# Patient Record
Sex: Male | Born: 1935 | Race: White | Hispanic: No | Marital: Married | Smoking: Former smoker
Health system: Southern US, Community
[De-identification: ages and names within clinical notes are randomized; demographics above are authoritative.]

## PROBLEM LIST (undated history)

## (undated) DIAGNOSIS — K529 Noninfective gastroenteritis and colitis, unspecified: Secondary | ICD-10-CM

## (undated) DIAGNOSIS — F0390 Unspecified dementia without behavioral disturbance: Secondary | ICD-10-CM

## (undated) DIAGNOSIS — E785 Hyperlipidemia, unspecified: Secondary | ICD-10-CM

## (undated) DIAGNOSIS — L409 Psoriasis, unspecified: Secondary | ICD-10-CM

## (undated) DIAGNOSIS — M1990 Unspecified osteoarthritis, unspecified site: Secondary | ICD-10-CM

## (undated) DIAGNOSIS — K219 Gastro-esophageal reflux disease without esophagitis: Secondary | ICD-10-CM

## (undated) DIAGNOSIS — R519 Headache, unspecified: Secondary | ICD-10-CM

## (undated) DIAGNOSIS — IMO0001 Reserved for inherently not codable concepts without codable children: Secondary | ICD-10-CM

## (undated) DIAGNOSIS — C4490 Unspecified malignant neoplasm of skin, unspecified: Secondary | ICD-10-CM

## (undated) DIAGNOSIS — Z87442 Personal history of urinary calculi: Secondary | ICD-10-CM

## (undated) DIAGNOSIS — I679 Cerebrovascular disease, unspecified: Secondary | ICD-10-CM

## (undated) DIAGNOSIS — I639 Cerebral infarction, unspecified: Secondary | ICD-10-CM

## (undated) DIAGNOSIS — I509 Heart failure, unspecified: Secondary | ICD-10-CM

## (undated) DIAGNOSIS — I499 Cardiac arrhythmia, unspecified: Secondary | ICD-10-CM

## (undated) DIAGNOSIS — G4730 Sleep apnea, unspecified: Secondary | ICD-10-CM

## (undated) DIAGNOSIS — N400 Enlarged prostate without lower urinary tract symptoms: Secondary | ICD-10-CM

## (undated) HISTORY — PX: COLONOSCOPY: SHX174

## (undated) HISTORY — PX: UPPER GI ENDOSCOPY: SHX6162

---

## 1998-07-29 DIAGNOSIS — I639 Cerebral infarction, unspecified: Secondary | ICD-10-CM

## 1998-07-29 HISTORY — DX: Cerebral infarction, unspecified: I63.9

## 2006-03-04 ENCOUNTER — Ambulatory Visit: Payer: Self-pay | Admitting: Ophthalmology

## 2006-03-10 ENCOUNTER — Ambulatory Visit: Payer: Self-pay | Admitting: Ophthalmology

## 2007-01-06 ENCOUNTER — Ambulatory Visit: Payer: Self-pay | Admitting: Internal Medicine

## 2009-04-18 ENCOUNTER — Ambulatory Visit: Payer: Self-pay | Admitting: Internal Medicine

## 2009-04-18 ENCOUNTER — Ambulatory Visit: Payer: Self-pay | Admitting: Ophthalmology

## 2009-05-01 ENCOUNTER — Ambulatory Visit: Payer: Self-pay | Admitting: Ophthalmology

## 2009-05-31 ENCOUNTER — Emergency Department: Payer: Self-pay | Admitting: Emergency Medicine

## 2009-08-23 ENCOUNTER — Ambulatory Visit: Payer: Self-pay | Admitting: Gastroenterology

## 2012-03-18 ENCOUNTER — Ambulatory Visit: Payer: Self-pay

## 2012-06-02 ENCOUNTER — Ambulatory Visit: Payer: Self-pay | Admitting: Urology

## 2013-01-03 ENCOUNTER — Emergency Department: Payer: Self-pay | Admitting: Emergency Medicine

## 2013-01-03 LAB — CBC
HGB: 13.4 g/dL (ref 13.0–18.0)
MCH: 30.6 pg (ref 26.0–34.0)
MCHC: 33.2 g/dL (ref 32.0–36.0)
RBC: 4.39 10*6/uL — ABNORMAL LOW (ref 4.40–5.90)
RDW: 13.7 % (ref 11.5–14.5)

## 2013-01-03 LAB — URINALYSIS, COMPLETE
Bacteria: NONE SEEN
Bilirubin,UR: NEGATIVE
Glucose,UR: NEGATIVE mg/dL (ref 0–75)
Ketone: NEGATIVE
Leukocyte Esterase: NEGATIVE
Nitrite: NEGATIVE
Ph: 5 (ref 4.5–8.0)
Protein: NEGATIVE
Specific Gravity: 1.028 (ref 1.003–1.030)
Squamous Epithelial: NONE SEEN
WBC UR: 1 /HPF (ref 0–5)

## 2013-01-03 LAB — COMPREHENSIVE METABOLIC PANEL
BUN: 19 mg/dL — ABNORMAL HIGH (ref 7–18)
Calcium, Total: 8.8 mg/dL (ref 8.5–10.1)
Chloride: 109 mmol/L — ABNORMAL HIGH (ref 98–107)
Co2: 23 mmol/L (ref 21–32)
Creatinine: 1.19 mg/dL (ref 0.60–1.30)
EGFR (Non-African Amer.): 58 — ABNORMAL LOW
Potassium: 4.3 mmol/L (ref 3.5–5.1)
SGOT(AST): 30 U/L (ref 15–37)
Sodium: 140 mmol/L (ref 136–145)
Total Protein: 7.1 g/dL (ref 6.4–8.2)

## 2013-01-03 LAB — PROTIME-INR
INR: 2.3
Prothrombin Time: 24.6 secs — ABNORMAL HIGH (ref 11.5–14.7)

## 2013-01-05 ENCOUNTER — Emergency Department: Payer: Self-pay | Admitting: Emergency Medicine

## 2013-01-05 LAB — BASIC METABOLIC PANEL
Anion Gap: 4 — ABNORMAL LOW (ref 7–16)
Calcium, Total: 8 mg/dL — ABNORMAL LOW (ref 8.5–10.1)
Chloride: 106 mmol/L (ref 98–107)
Creatinine: 1.27 mg/dL (ref 0.60–1.30)
EGFR (African American): >60
EGFR (Non-African Amer.): 54 — ABNORMAL LOW
Glucose: 115 mg/dL — ABNORMAL HIGH (ref 65–99)
Osmolality: 279 (ref 275–301)
Potassium: 4 mmol/L (ref 3.5–5.1)
Sodium: 138 mmol/L (ref 136–145)

## 2013-01-15 ENCOUNTER — Ambulatory Visit: Payer: Self-pay | Admitting: Urology

## 2013-04-16 ENCOUNTER — Other Ambulatory Visit: Payer: Self-pay | Admitting: Gastroenterology

## 2013-04-16 LAB — WBCS, STOOL

## 2013-04-19 LAB — STOOL CULTURE

## 2013-08-12 IMAGING — CT CT ABD-PELV W/ CM
1 of 3 series · 13 of 32 positions shown, 18 images · non-contrast
Comparison: none

REASON FOR EXAM: [id number]  after 3oclock dr Gaucin [id number]  back pain
nausea  abd pain elev...
COMMENTS:

[Series 2: 3mm soft tissue · axial · 0.84mm/px · z∈[-462,-57]mm · 13 of 157 slices shown, 18 images]
[im 11/157  soft-tissue]
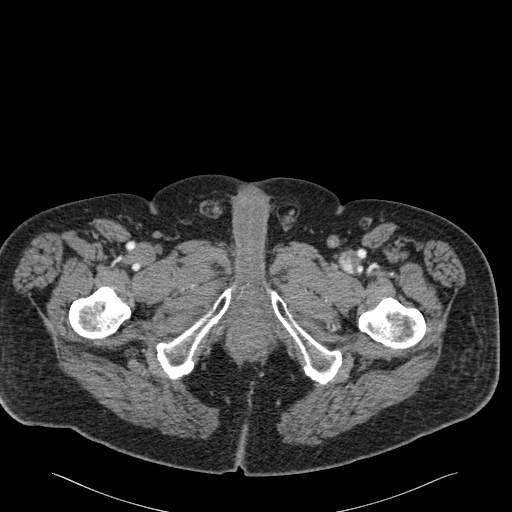
[im 11/157  bone]
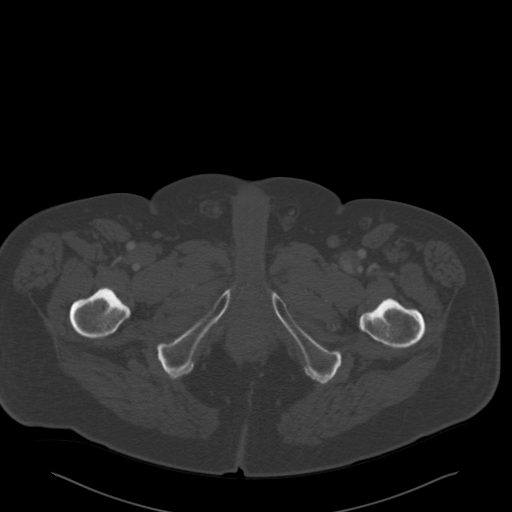
[im 21/157  soft-tissue]
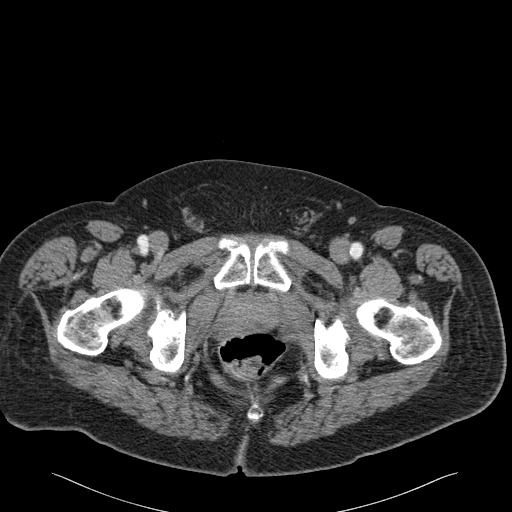
[im 32/157  soft-tissue]
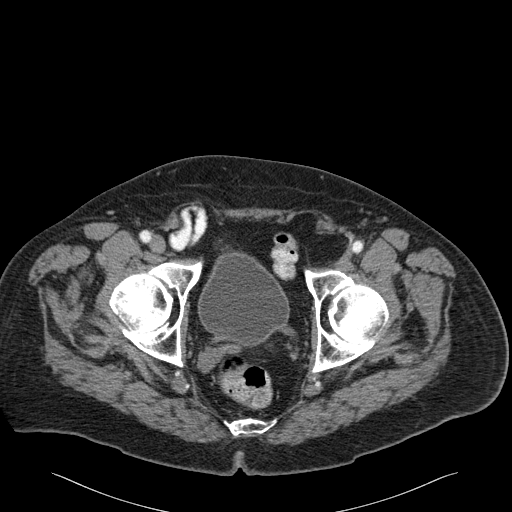
[im 53/157  soft-tissue]
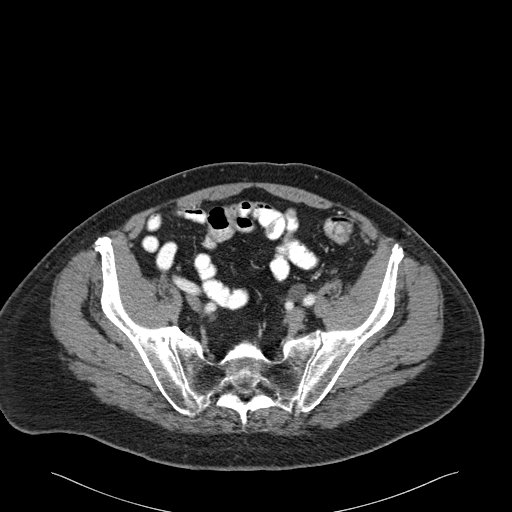
[im 63/157  soft-tissue]
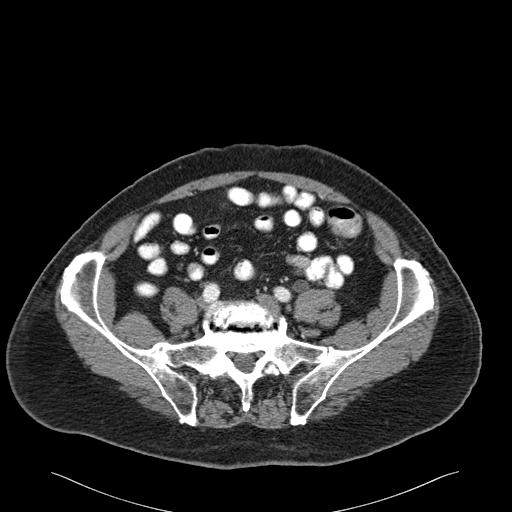
[im 73/157  soft-tissue]
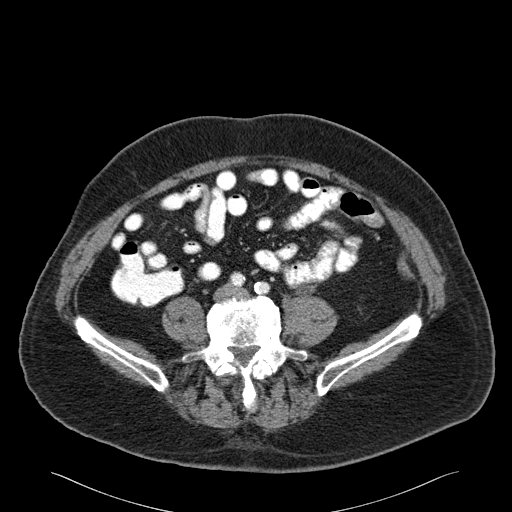
[im 84/157  soft-tissue]
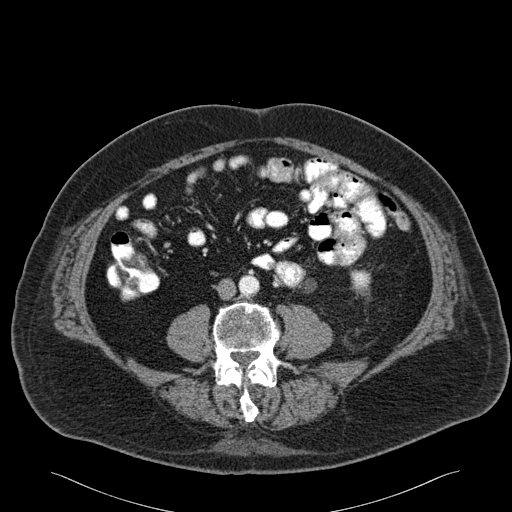
[im 94/157  soft-tissue]
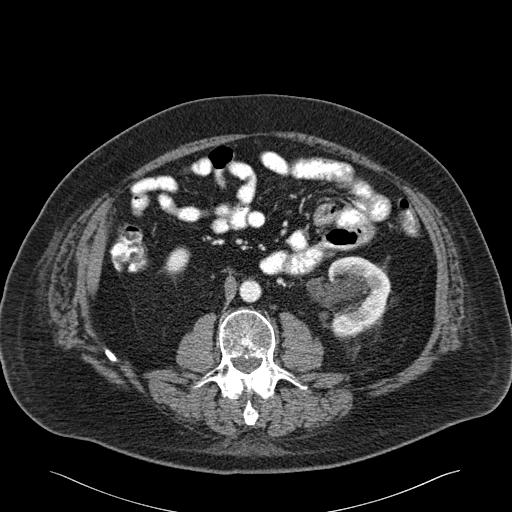
[im 105/157  soft-tissue]
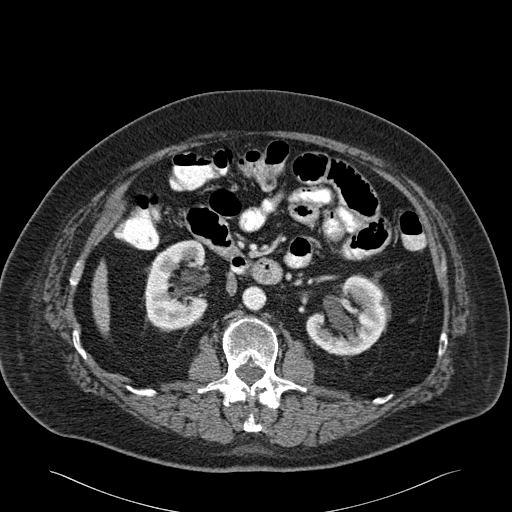
[im 105/157  bone]
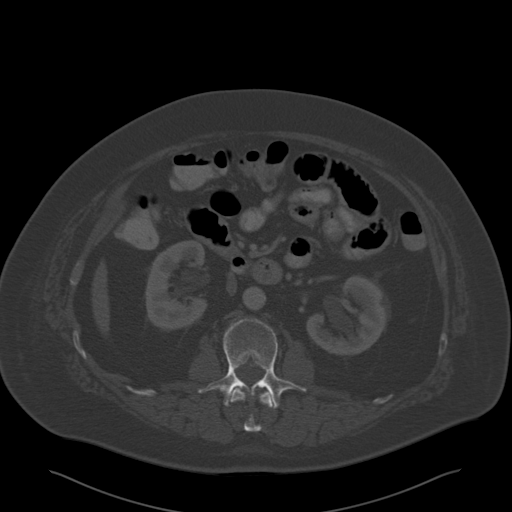
[im 115/157  lung]
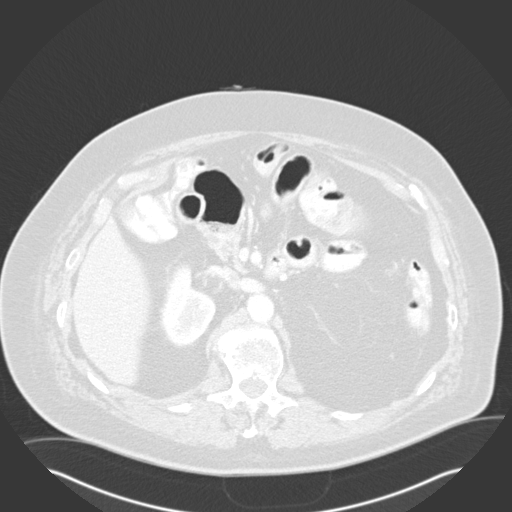
[im 125/157  soft-tissue]
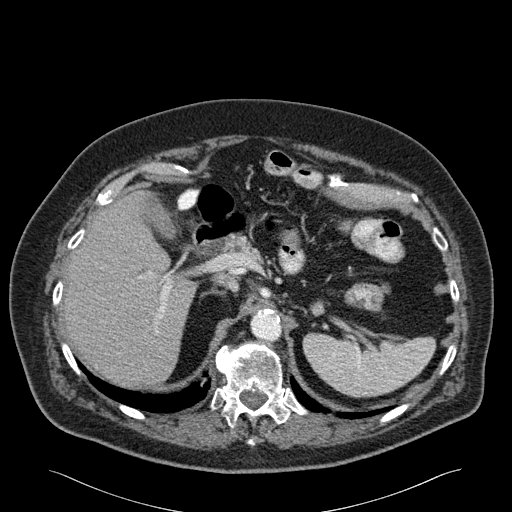
[im 125/157  lung]
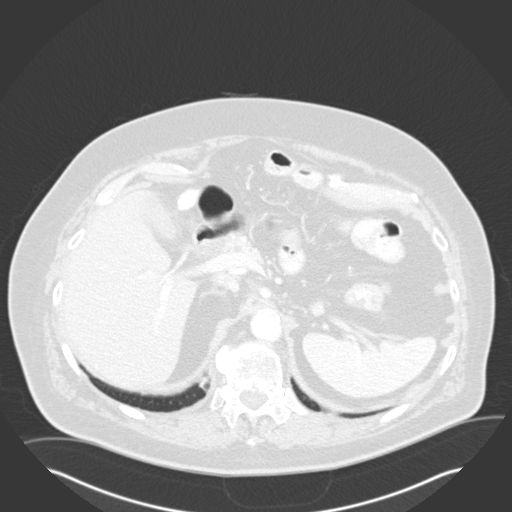
[im 136/157  soft-tissue]
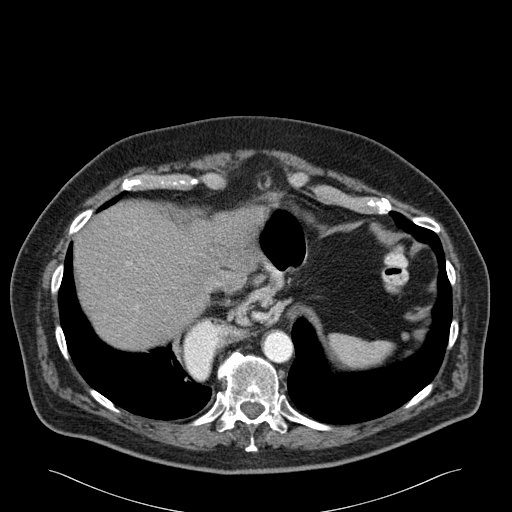
[im 136/157  lung]
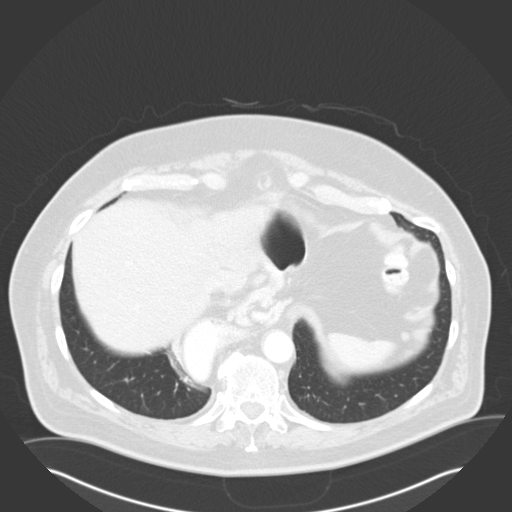
[im 146/157  soft-tissue]
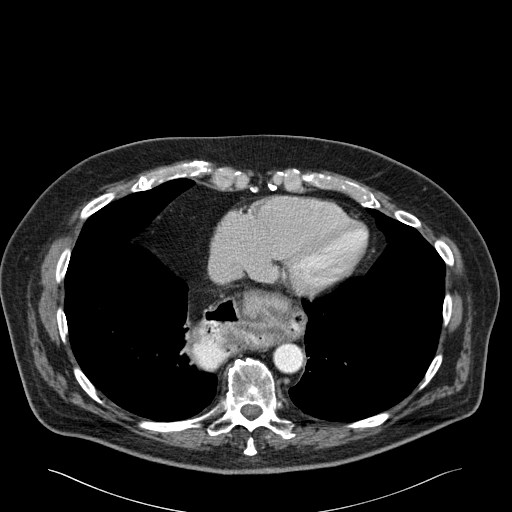
[im 146/157  lung]
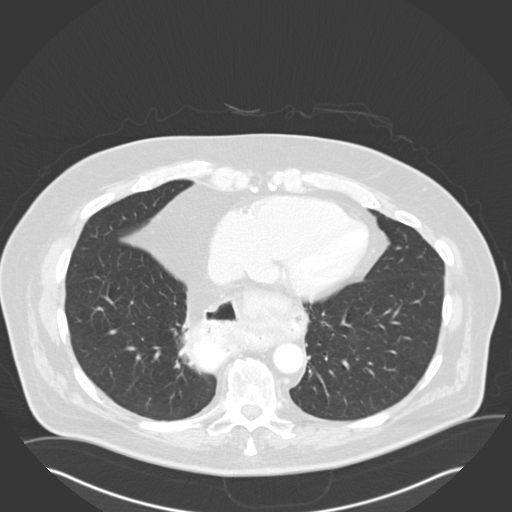

[13 of 32 positions shown; findings below may reference images not displayed]

PROCEDURE:     CT  - CT ABDOMEN / PELVIS  W  - March 18, 2012  [DATE]

RESULT:     Axial CT scanning was performed through the abdomen and pelvis
with reconstructions at 3 mm intervals and slice thicknesses following
intravenous administration of 100 cc of Lsovue-400. The patient also
received oral contrast material. Review of multiplanar reconstructed images
was performed separately on the VIA monitor.

The liver exhibits no focal mass or ductal dilation. The gallbladder is
adequately distended with no evidence of stones or acute inflammatory
changes. The pancreas exhibits no acute abnormality. A portion of the
pancreatic body extends into the thorax with the adjacent partially
intrathoracic stomach. The spleen is not enlarged. There are no adrenal
masses.

The kidneys exhibit parapelvic cysts. There is mild hydronephrosis and
hydroureter on the left which appears to be related to soft tissue density
at the left ureterovesical junction. No stone is demonstrated. No
significant increased density in the perinephric fat is demonstrated. The
right ureter is normal in caliber.

The orally administered contrast has traversed the small bowel. There are
loops of small bowel in a right inguinal hernia which does not appear to be
incarcerated. Contrast has reached as far distally as the distal descending
colon. There is no evidence of a large bowel obstruction. There is no
evidence of acute diverticulitis. There is no psoas abscess. The partially
distended urinary bladder exhibits small diverticula. The perivesical soft
tissues appear normal. The prostate gland is normal in density. There is
mild fullness of the left seminal vesicle.
IMPRESSION: 1. There is mild hydronephrosis and hydroureter on the left without
significant inflammatory change in the surrounding perinephric fat. This
appears to be related to some narrowing of the ureterovesical junction on
the left which may be due to intrinsic bladder or prostate processes.  No
stones are identified. The right kidney and ureter appear normal. There are
bladder diverticula. Urologic consultation and possible cystoscopy is
recommended.
2. There is a small right inguinal hernia containing loops of normal
appearing small bowel. There is no evidence of a small or large bowel
obstruction. There is no evidence of acute diverticulitis or other acute
bowel inflammation.
3. There is no evidence of acute hepatobiliary abnormality.
4. There is mild fullness of the left aspect of the seminal vesicles.

This report was discussed by me with Dr. Geovanny by telephone at [DATE] p.m. on
18 March, 2012.

[REDACTED]

## 2014-06-11 IMAGING — CT CT PELVIS W/O CM
1 series · 16 of 32 positions shown, 20 images · non-contrast
Comparison: none

REASON FOR EXAM: UVJ Stone
COMMENTS:

PROCEDURE:     CT  - CT PELVIS STANDARD WO  - January 15, 2013  [DATE]
RESULT:     Comparison is made to a prior study dated 01/03/2013.
TECHNIQUE: Helical noncontrasted 3 mm sections were obtained from the
superior iliac crest through the pubic symphysis.

[Series 2: pelvis · axial · 0.84mm/px · z∈[-600,-336]mm · 16 of 99 slices shown, 20 images]
[im 7/99  soft-tissue]
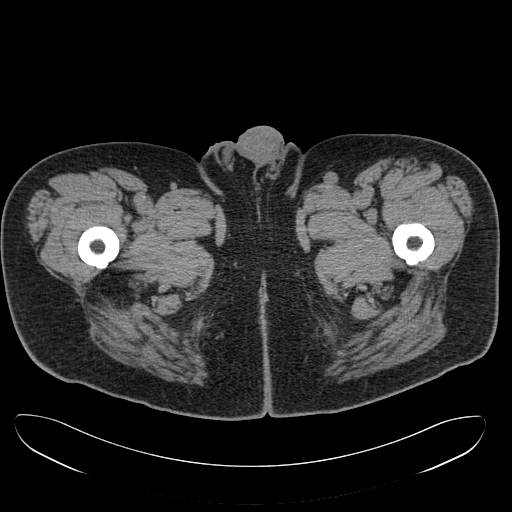
[im 7/99  bone]
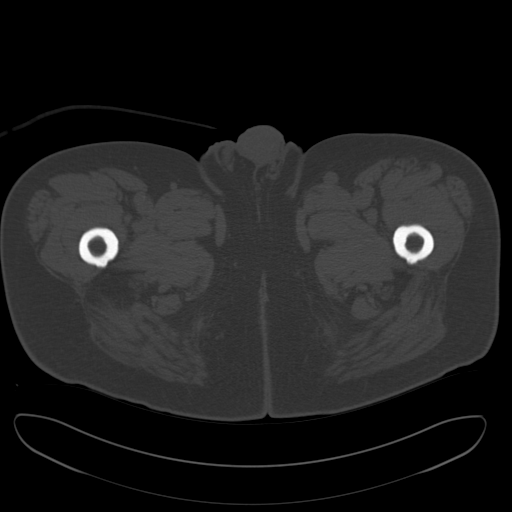
[im 13/99  soft-tissue]
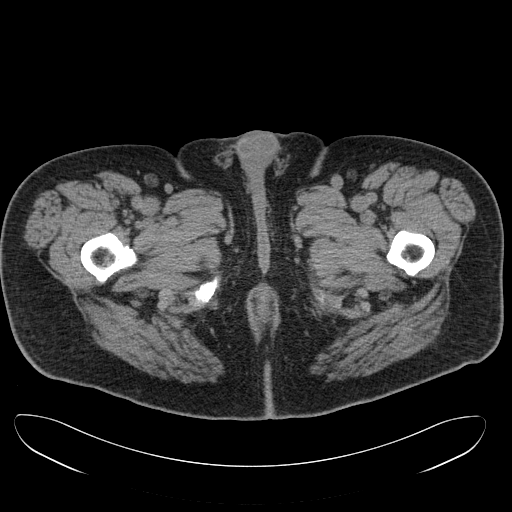
[im 19/99  soft-tissue]
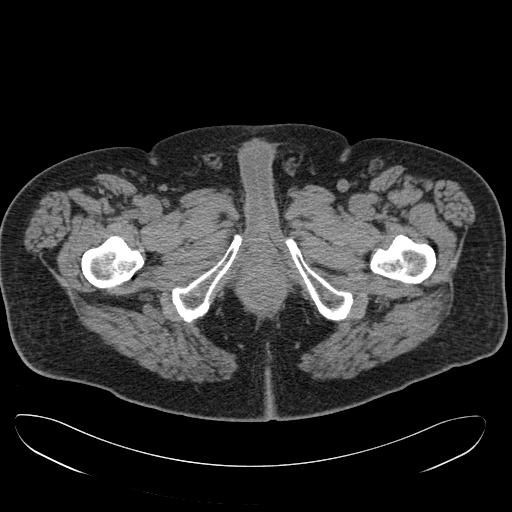
[im 26/99  soft-tissue]
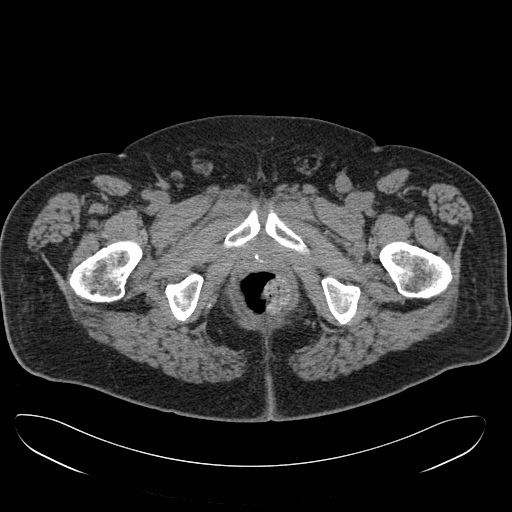
[im 32/99  soft-tissue]
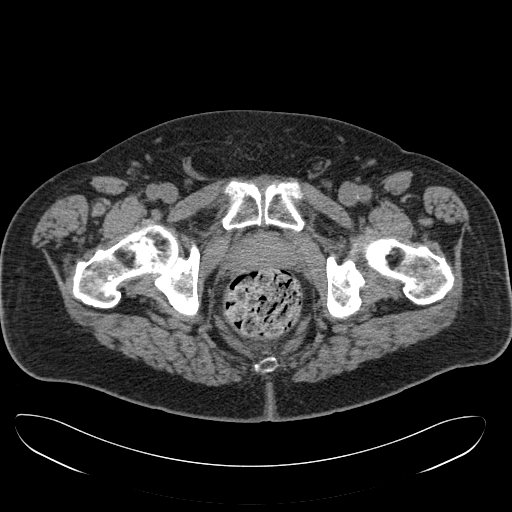
[im 38/99  soft-tissue]
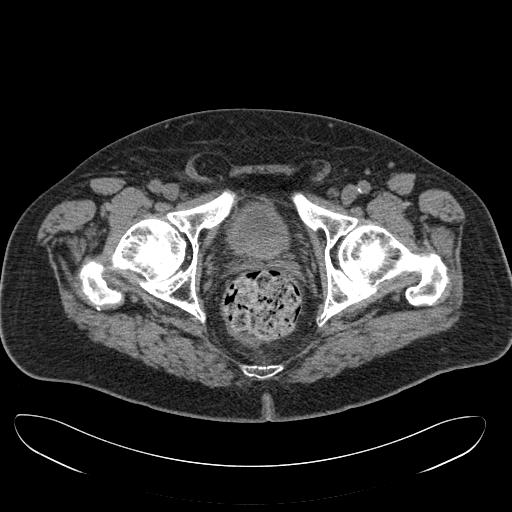
[im 45/99  soft-tissue]
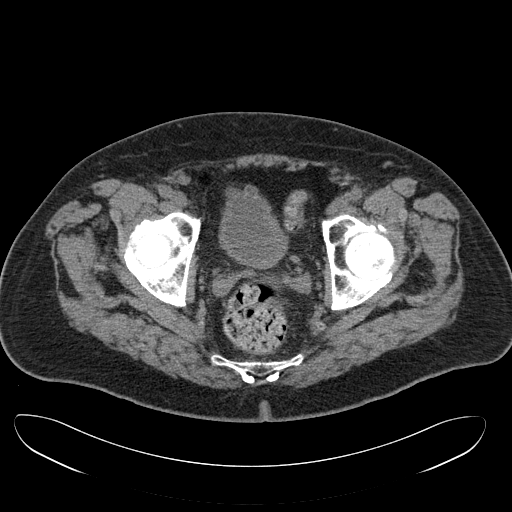
[im 54/99  soft-tissue]
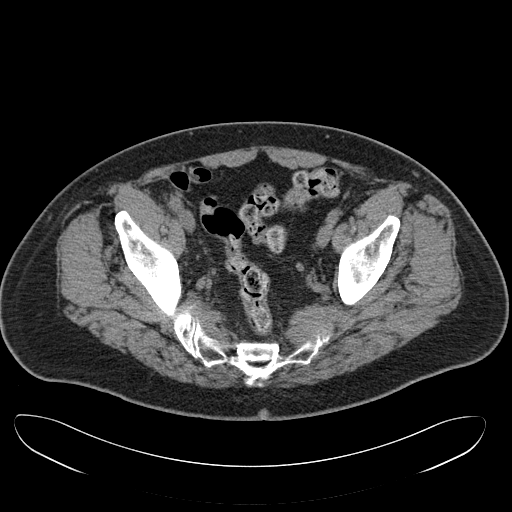
[im 61/99  soft-tissue]
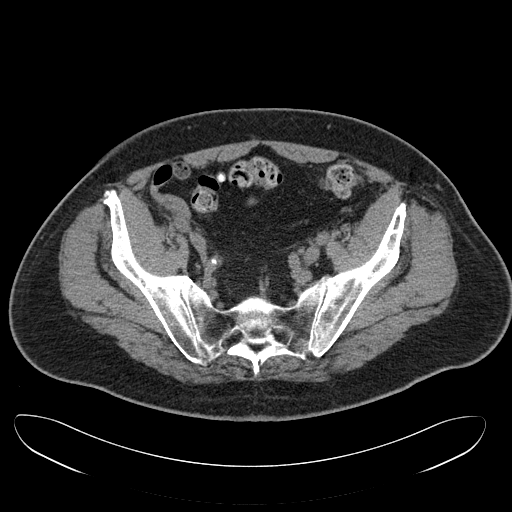
[im 61/99  bone]
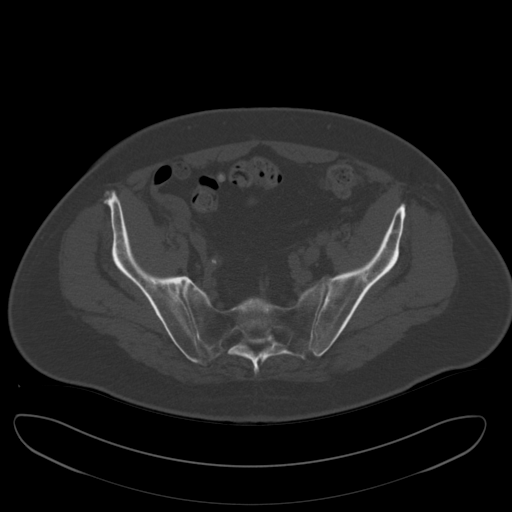
[im 67/99  soft-tissue]
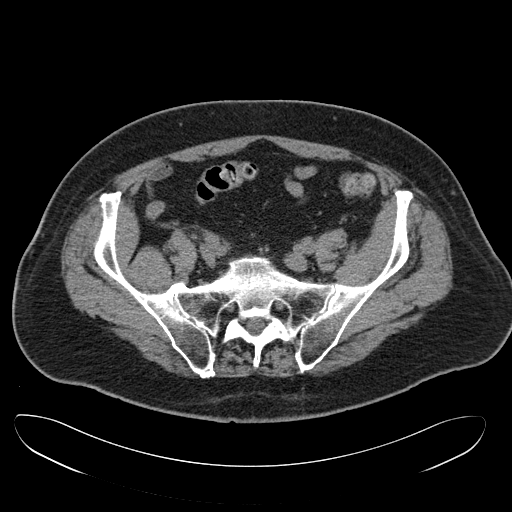
[im 73/99  soft-tissue]
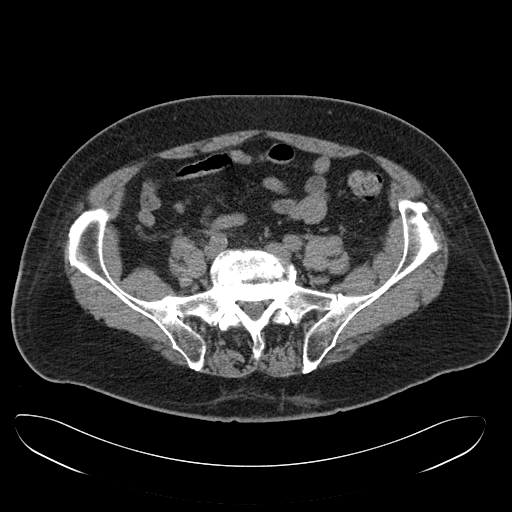
[im 80/99  soft-tissue]
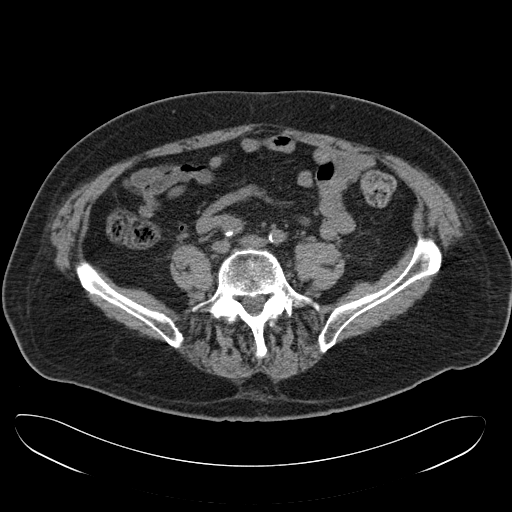
[im 86/99  soft-tissue]
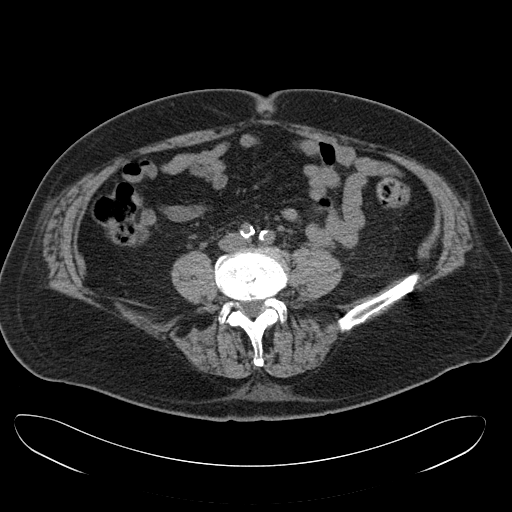
[im 86/99  lung]
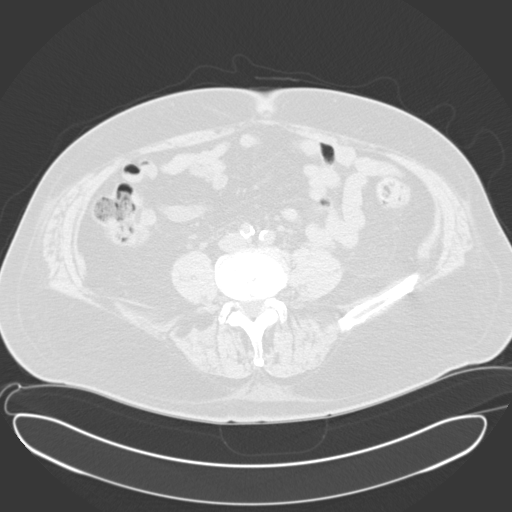
[im 89/99  lung]
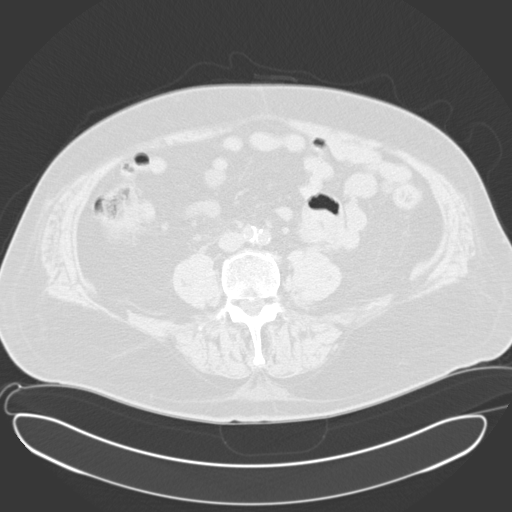
[im 92/99  soft-tissue]
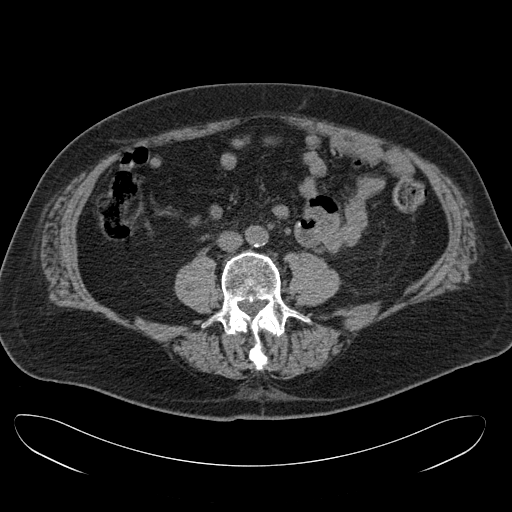
[im 92/99  lung]
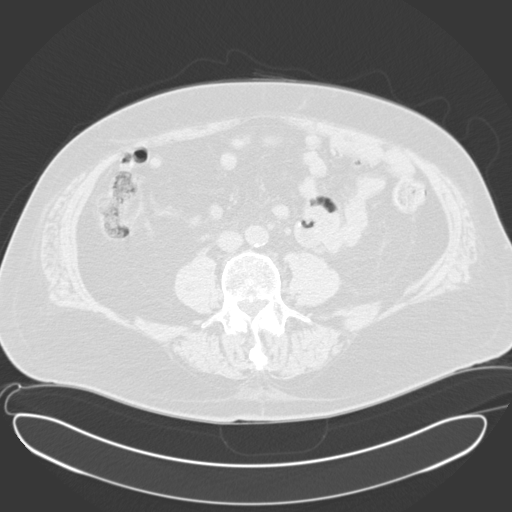
[im 95/99  lung]
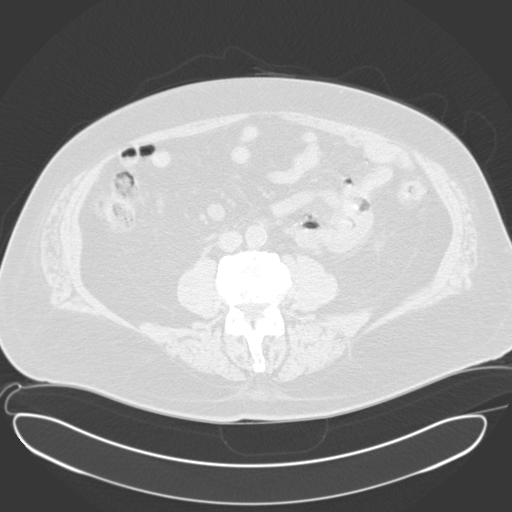

[16 of 32 positions shown; findings below may reference images not displayed]

FINDINGS: The previously described 3 millimeter calculus within the distal
right ureter at the level of the ureterovesical junction is not appreciated
on the present study. There does not appear to be significant hydroureter.
There is no evidence of pelvic masses, free fluid nor loculated fluid
collections. There is no evidence of bowel obstruction.
IMPRESSION: 1. The previously described distal right ureteral calculus is not
appreciated on the present study. The calculus is described at the level of
the ureterovesical junction.

## 2015-06-16 ENCOUNTER — Encounter: Payer: Self-pay | Admitting: *Deleted

## 2015-06-19 ENCOUNTER — Encounter: Payer: Self-pay | Admitting: *Deleted

## 2015-06-19 ENCOUNTER — Ambulatory Visit: Payer: Medicare Other | Admitting: Anesthesiology

## 2015-06-19 ENCOUNTER — Ambulatory Visit
Admission: RE | Admit: 2015-06-19 | Discharge: 2015-06-19 | Disposition: A | Discharge status: Home / Self Care | Payer: Medicare Other | Source: Ambulatory Visit | Attending: Gastroenterology | Admitting: Gastroenterology

## 2015-06-19 ENCOUNTER — Encounter
Admission: RE | Disposition: A | Discharge status: Home / Self Care | Payer: Self-pay | Source: Ambulatory Visit | Attending: Gastroenterology

## 2015-06-19 DIAGNOSIS — Z87891 Personal history of nicotine dependence: Secondary | ICD-10-CM | POA: Diagnosis not present

## 2015-06-19 DIAGNOSIS — L409 Psoriasis, unspecified: Secondary | ICD-10-CM | POA: Diagnosis not present

## 2015-06-19 DIAGNOSIS — D122 Benign neoplasm of ascending colon: Secondary | ICD-10-CM | POA: Insufficient documentation

## 2015-06-19 DIAGNOSIS — Z8673 Personal history of transient ischemic attack (TIA), and cerebral infarction without residual deficits: Secondary | ICD-10-CM | POA: Diagnosis not present

## 2015-06-19 DIAGNOSIS — E785 Hyperlipidemia, unspecified: Secondary | ICD-10-CM | POA: Insufficient documentation

## 2015-06-19 DIAGNOSIS — N400 Enlarged prostate without lower urinary tract symptoms: Secondary | ICD-10-CM | POA: Diagnosis not present

## 2015-06-19 DIAGNOSIS — Z8249 Family history of ischemic heart disease and other diseases of the circulatory system: Secondary | ICD-10-CM | POA: Diagnosis not present

## 2015-06-19 DIAGNOSIS — Z79899 Other long term (current) drug therapy: Secondary | ICD-10-CM | POA: Insufficient documentation

## 2015-06-19 DIAGNOSIS — I509 Heart failure, unspecified: Secondary | ICD-10-CM | POA: Diagnosis not present

## 2015-06-19 DIAGNOSIS — D649 Anemia, unspecified: Secondary | ICD-10-CM | POA: Insufficient documentation

## 2015-06-19 DIAGNOSIS — G3184 Mild cognitive impairment, so stated: Secondary | ICD-10-CM | POA: Diagnosis not present

## 2015-06-19 DIAGNOSIS — D125 Benign neoplasm of sigmoid colon: Secondary | ICD-10-CM | POA: Diagnosis not present

## 2015-06-19 DIAGNOSIS — Z7901 Long term (current) use of anticoagulants: Secondary | ICD-10-CM | POA: Diagnosis not present

## 2015-06-19 DIAGNOSIS — R195 Other fecal abnormalities: Secondary | ICD-10-CM | POA: Insufficient documentation

## 2015-06-19 DIAGNOSIS — M1990 Unspecified osteoarthritis, unspecified site: Secondary | ICD-10-CM | POA: Insufficient documentation

## 2015-06-19 DIAGNOSIS — Z85828 Personal history of other malignant neoplasm of skin: Secondary | ICD-10-CM | POA: Diagnosis not present

## 2015-06-19 DIAGNOSIS — Z800 Family history of malignant neoplasm of digestive organs: Secondary | ICD-10-CM | POA: Insufficient documentation

## 2015-06-19 DIAGNOSIS — K5730 Diverticulosis of large intestine without perforation or abscess without bleeding: Secondary | ICD-10-CM | POA: Diagnosis not present

## 2015-06-19 DIAGNOSIS — Z86010 Personal history of colonic polyps: Secondary | ICD-10-CM | POA: Diagnosis present

## 2015-06-19 DIAGNOSIS — D124 Benign neoplasm of descending colon: Secondary | ICD-10-CM | POA: Diagnosis not present

## 2015-06-19 DIAGNOSIS — I4891 Unspecified atrial fibrillation: Secondary | ICD-10-CM | POA: Insufficient documentation

## 2015-06-19 HISTORY — DX: Psoriasis, unspecified: L40.9

## 2015-06-19 HISTORY — DX: Gastro-esophageal reflux disease without esophagitis: K21.9

## 2015-06-19 HISTORY — DX: Cardiac arrhythmia, unspecified: I49.9

## 2015-06-19 HISTORY — DX: Unspecified dementia, unspecified severity, without behavioral disturbance, psychotic disturbance, mood disturbance, and anxiety: F03.90

## 2015-06-19 HISTORY — DX: Hyperlipidemia, unspecified: E78.5

## 2015-06-19 HISTORY — DX: Sleep apnea, unspecified: G47.30

## 2015-06-19 HISTORY — DX: Unspecified osteoarthritis, unspecified site: M19.90

## 2015-06-19 HISTORY — DX: Unspecified malignant neoplasm of skin, unspecified: C44.90

## 2015-06-19 HISTORY — DX: Noninfective gastroenteritis and colitis, unspecified: K52.9

## 2015-06-19 HISTORY — DX: Heart failure, unspecified: I50.9

## 2015-06-19 HISTORY — DX: Cerebral infarction, unspecified: I63.9

## 2015-06-19 HISTORY — PX: COLONOSCOPY WITH PROPOFOL: SHX5780

## 2015-06-19 HISTORY — DX: Cerebrovascular disease, unspecified: I67.9

## 2015-06-19 HISTORY — DX: Benign prostatic hyperplasia without lower urinary tract symptoms: N40.0

## 2015-06-19 HISTORY — DX: Reserved for inherently not codable concepts without codable children: IMO0001

## 2015-06-19 SURGERY — COLONOSCOPY WITH PROPOFOL
Anesthesia: General

## 2015-06-19 MED ORDER — SODIUM CHLORIDE 0.9 % IV SOLN: INTRAVENOUS | Status: DC

## 2015-06-19 MED ORDER — SODIUM CHLORIDE 0.9 % IV SOLN
INTRAVENOUS | Status: DC
  Administered 2015-06-19: 1000 mL via INTRAVENOUS
  Administered 2015-06-19: 13:00:00 via INTRAVENOUS

## 2015-06-19 MED ORDER — PROPOFOL 500 MG/50ML IV EMUL
INTRAVENOUS | Status: DC | PRN
  Administered 2015-06-19: 110 ug/kg/min via INTRAVENOUS

## 2015-06-19 MED ORDER — METOPROLOL TARTRATE 50 MG PO TABS
ORAL_TABLET | ORAL | Status: AC
  Filled 2015-06-19: qty 1

## 2015-06-19 MED ORDER — FENTANYL CITRATE (PF) 100 MCG/2ML IJ SOLN: 25.0000 ug | INTRAMUSCULAR | Status: DC | PRN

## 2015-06-19 MED ORDER — ONDANSETRON HCL 4 MG/2ML IJ SOLN: 4.0000 mg | Freq: Once | INTRAMUSCULAR | Status: DC | PRN

## 2015-06-19 MED ORDER — METOPROLOL TARTRATE 50 MG PO TABS
50.0000 mg | ORAL_TABLET | Freq: Once | ORAL | Status: AC
  Administered 2015-06-19: 50 mg via ORAL

## 2015-06-19 NOTE — Transfer of Care (Signed)
Immediate Anesthesia Transfer of Care Note  Patient: Henry Park  Procedure(s) Performed: Procedure(s): COLONOSCOPY WITH PROPOFOL (N/A)  Patient Location: PACU and Endoscopy Unit  Anesthesia Type:General  Level of Consciousness: sedated and responds to stimulation  Airway & Oxygen Therapy: Patient Spontanous Breathing and Patient connected to nasal cannula oxygen  Post-op Assessment: Report given to RN and Post -op Vital signs reviewed and stable  Post vital signs: Reviewed and stable  Last Vitals:  Filed Vitals:   06/19/15 1330 06/19/15 1333  BP:  98/56  Pulse: 82 91  Temp: 37.5 C 37.4 C  Resp: 12 15    Complications: No apparent anesthesia complications

## 2015-06-19 NOTE — H&P (Signed)
  Date of Initial H&P: 06/01/2015 History reviewed, patient examined, no change in status, stable for surgery.

## 2015-06-19 NOTE — Anesthesia Preprocedure Evaluation (Addendum)
Anesthesia Evaluation  Patient identified by MRN, date of birth, ID band  Reviewed: Allergy & Precautions, NPO status , Patient's Chart, lab work & pertinent test results, reviewed documented beta blocker date and time   Airway Mallampati: II       Dental no notable dental hx.    Pulmonary shortness of breath and with exertion, sleep apnea , former smoker,    Pulmonary exam normal breath sounds clear to auscultation       Cardiovascular +CHF  Normal cardiovascular exam+ dysrhythmias      Neuro/Psych Some dementiaCVA    GI/Hepatic Neg liver ROS, GERD  Medicated and Controlled,  Endo/Other  negative endocrine ROS  Renal/GU negative Renal ROS  negative genitourinary   Musculoskeletal  (+) Arthritis , Osteoarthritis,    Abdominal Normal abdominal exam  (+)   Peds negative pediatric ROS (+)  Hematology negative hematology ROS (+)   Anesthesia Other Findings   Reproductive/Obstetrics                            Anesthesia Physical Anesthesia Plan  ASA: III  Anesthesia Plan: General   Post-op Pain Management:    Induction: Intravenous  Airway Management Planned: Nasal Cannula  Additional Equipment:   Intra-op Plan:   Post-operative Plan:   Informed Consent: I have reviewed the patients History and Physical, chart, labs and discussed the procedure including the risks, benefits and alternatives for the proposed anesthesia with the patient or authorized representative who has indicated his/her understanding and acceptance.   Dental advisory given  Plan Discussed with: CRNA and Surgeon  Anesthesia Plan Comments:         Anesthesia Quick Evaluation

## 2015-06-19 NOTE — Op Note (Signed)
[facility]astroenterology Patient Name: Henry Park Procedure Date: 06/19/2015 1:07 PM MRN: [id number] Account #: [id number] Date of Birth: 10-09-1931 Admit Type: Outpatient Age: 81 Room: [facility] ENDO ROOM 4 Gender: Male Note Status: Finalized Procedure:         Colonoscopy Indications:       Heme positive stool, Anemia, Personal history of colonic                     polyps Providers:         Lupita Dawn. Candace Cruise, MD Referring MD:      Hewitt Blade. Sarina Ser, MD (Referring MD) Medicines:         Monitored Anesthesia Care Complications:     No immediate complications. Procedure:         Pre-Anesthesia Assessment:                    - Prior to the procedure, a History and Physical was                     performed, and patient medications, allergies and                     sensitivities were reviewed. The patient's tolerance of                     previous anesthesia was reviewed.                    - The risks and benefits of the procedure and the sedation                     options and risks were discussed with the patient. All                     questions were answered and informed consent was obtained.                    - After reviewing the risks and benefits, the patient was                     deemed in satisfactory condition to undergo the procedure.                    After obtaining informed consent, the colonoscope was                     passed under direct vision. Throughout the procedure, the                     patient's blood pressure, pulse, and oxygen saturations                     were monitored continuously. The Olympus CF-H180AL                     colonoscope ( S#: [id number] ) was introduced through the                     anus and advanced to the the cecum, identified by                     appendiceal orifice and ileocecal valve. The colonoscopy  was performed without difficulty. The patient tolerated                     the  procedure well. The quality of the bowel preparation                     was good. Findings:      A few small and large-mouthed diverticula were found in the sigmoid       colon.      Three sessile polyps were found in the sigmoid colon and in the       descending colon. The polyps were small in size. These polyps were       removed with a hot snare. Resection and retrieval were complete.      The exam was otherwise without abnormality. Impression:        - Diverticulosis in the sigmoid colon.                    - Three small polyps in the sigmoid colon and in the                     descending colon. Resected and retrieved.                    - The examination was otherwise normal. Recommendation:    - Discharge patient to home.                    - Await pathology results.                    - The findings and recommendations were discussed with the                     patient. Procedure Code(s): --- Professional ---                    [phone], Colonoscopy, flexible; with removal of tumor(s),                     polyp(s), or other lesion(s) by snare technique Diagnosis Code(s): --- Professional ---                    D12.5, Benign neoplasm of sigmoid colon                    D12.4, Benign neoplasm of descending colon                    R19.5, Other fecal abnormalities                    D64.9, Anemia, unspecified                    Z86.010, Personal history of colonic polyps                    K57.30, Diverticulosis of large intestine without                     perforation or abscess without bleeding CPT copyright 2014 American Medical Association. All rights reserved. The codes documented in this report are preliminary and upon coder review may  be revised to meet current compliance requirements. Hulen Luster, MD 06/19/2015 1:29:47 PM This report has been signed electronically. Number of Addenda: 0 Note  Initiated On: 06/19/2015 1:07 PM Scope Withdrawal Time: 0 hours 7 minutes 13  seconds  Total Procedure Duration: 0 hours 14 minutes 17 seconds       [facility]

## 2015-06-20 ENCOUNTER — Encounter: Payer: Self-pay | Admitting: Gastroenterology

## 2015-06-20 LAB — SURGICAL PATHOLOGY

## 2015-06-23 NOTE — Anesthesia Postprocedure Evaluation (Signed)
Anesthesia Post Note  Patient: Henry Park  Procedure(s) Performed: Procedure(s) (LRB): COLONOSCOPY WITH PROPOFOL (N/A)  Patient location during evaluation: Endoscopy Anesthesia Type: General Level of consciousness: awake, awake and alert and oriented Pain management: pain level controlled Vital Signs Assessment: post-procedure vital signs reviewed and stable Respiratory status: spontaneous breathing Cardiovascular status: blood pressure returned to baseline Anesthetic complications: no    Last Vitals:  Filed Vitals:   06/19/15 1400 06/19/15 1410  BP: 106/79 108/72  Pulse: 90 73  Temp:    Resp: 22 15    Last Pain:  Filed Vitals:   06/20/15 0747  PainSc: 0-No pain                 [facility] Reidinger

## 2019-12-17 ENCOUNTER — Encounter: Payer: Self-pay | Admitting: Urology

## 2020-01-12 ENCOUNTER — Ambulatory Visit (INDEPENDENT_AMBULATORY_CARE_PROVIDER_SITE_OTHER): Payer: Medicare Other | Admitting: Urology

## 2020-01-12 ENCOUNTER — Other Ambulatory Visit: Payer: Self-pay

## 2020-01-12 ENCOUNTER — Encounter: Payer: Self-pay | Admitting: Urology

## 2020-01-12 VITALS — BP 109/78 | HR 89 | Ht 69.0 in | Wt 216.0 lb

## 2020-01-12 DIAGNOSIS — N400 Enlarged prostate without lower urinary tract symptoms: Secondary | ICD-10-CM

## 2020-01-12 DIAGNOSIS — N401 Enlarged prostate with lower urinary tract symptoms: Secondary | ICD-10-CM

## 2020-01-12 DIAGNOSIS — R339 Retention of urine, unspecified: Secondary | ICD-10-CM

## 2020-01-12 LAB — BLADDER SCAN AMB NON-IMAGING: Scan Result: 107

## 2020-01-12 NOTE — Progress Notes (Signed)
12/28/19 3:43 PM   Henry Park 02/19/1932 [id number]  Referring provider: Baxter Hire, MD [place],  [place] [address] Chief Complaint  Patient presents with  . Benign Prostatic Hypertrophy    HPI: Henry Park is a 86 y.o. male who presents today for BPH with urinary hesitancy.  -2 years bothersome LUTS -On doxazosin and finasteride for greater than 8-9 months. No improvement in his urinary symptoms on these medications. -Most bothersome symptoms are urinary hesitancy and incontinence  -Denies hematuria or UTI -Recent right groin pain secondary to a hernia  -Denies abdominal, flank, or pelvic pain -IPSS score was 27/35     PMH: Past Medical History:  Diagnosis Date  . Arthritis   . BPH (benign prostatic hyperplasia)   . Cerebrovascular disease   . CHF (congestive heart failure) ([place])   . Colitis   . Dementia ([place])   . Dysrhythmia    atrial fibrillation  . GERD (gastroesophageal reflux disease)   . Hyperlipidemia   . Psoriasis   . Shortness of breath dyspnea   . Skin cancer   . Sleep apnea   . Stroke [facility])     Surgical History: Past Surgical History:  Procedure Laterality Date  . COLONOSCOPY    . COLONOSCOPY WITH PROPOFOL N/A 06/19/2015   Procedure: COLONOSCOPY WITH PROPOFOL;  Surgeon: Hulen Luster, MD;  Location: [facility] ENDOSCOPY;  Service: Gastroenterology;  Laterality: N/A;    Home Medications:  Allergies as of 01/12/2020   No Known Allergies     Medication List       Accurate as of January 12, 2020  3:43 PM. If you have any questions, ask your nurse or doctor.        atorvastatin 10 MG tablet Commonly known as: LIPITOR Take 10 mg by mouth daily.   donepezil 10 MG tablet Commonly known as: ARICEPT Take 10 mg by mouth at bedtime.   doxazosin 4 MG tablet Commonly known as: CARDURA Take 4 mg by mouth daily.   hyoscyamine 0.125 MG SL tablet Commonly known as: LEVSIN SL Place 0.125 mg under the tongue every 4  (four) hours as needed.   metoprolol tartrate 50 MG tablet Commonly known as: LOPRESSOR Take 50 mg by mouth 2 (two) times daily.   pantoprazole 40 MG tablet Commonly known as: PROTONIX Take 40 mg by mouth daily.   warfarin 3 MG tablet Commonly known as: COUMADIN Take 3 mg by mouth daily.       Allergies: No Known Allergies  Family History: History reviewed. No pertinent family history.  Social History:  reports that he has quit smoking. He has never used smokeless tobacco. He reports that he does not drink alcohol. No history on file for drug use.   Physical Exam: BP 109/78   Pulse 89   Ht 5\' 9"  (1.753 m)   Wt 216 lb (98 kg)   BMI 31.90 kg/m   Constitutional:  Alert and oriented, No acute distress. HEENT: [place] AT, moist mucus membranes.  Trachea midline, no masses. Cardiovascular: No clubbing, cyanosis, or edema. Respiratory: Normal respiratory effort, no increased work of breathing. GI: Abdomen is soft, nontender, nondistended, no abdominal masses GU: Prostate 45 grams, smooth without nodules. Testes descend bilaterally without mass. Phallus is uncircumcised without lesions. Skin: No rashes, bruises or suspicious lesions. Neurologic: Grossly intact, no focal deficits, moving all 4 extremities. Psychiatric: Normal mood and affect.   Assessment & Plan:    1. BPH with LUTS -  Severe LUTS -No improvement on doxazosin and finasteride -PVR by bladder scan 105 mL -Discussed surgical options including uroloift, TURT, and PVP -He is interested in pursuing surgical management and will schedule a cystoscopy and TRUS prostate -Given samples Myrbtriq 25 mg daily while awaiting the studies.  [facility] [address] [place], [place] [address] [phone]  I, Joneen Boers Peace, am acting as a Education administrator for Dr. Nicki Reaper C. Laiyah Exline.  I have reviewed the above documentation for accuracy and completeness, and I agree with the above.   Abbie Sons, MD

## 2020-01-17 LAB — URINALYSIS, COMPLETE
Bilirubin, UA: NEGATIVE
Glucose, UA: NEGATIVE
Ketones, UA: NEGATIVE
Leukocytes,UA: NEGATIVE
Nitrite, UA: NEGATIVE
Protein,UA: NEGATIVE
RBC, UA: NEGATIVE
Specific Gravity, UA: >1.03 — ABNORMAL HIGH (ref 1.005–1.030)
Urobilinogen, Ur: 0.2 mg/dL (ref 0.2–1.0)
pH, UA: 5 (ref 5.0–7.5)

## 2020-01-17 LAB — MICROSCOPIC EXAMINATION
Bacteria, UA: NONE SEEN
Epithelial Cells (non renal): NONE SEEN /hpf (ref 0–10)

## 2020-02-16 NOTE — Progress Notes (Signed)
02/17/2020  Chief Complaint  Patient presents with  . Cysto     HPI: Henry Park is a 86 y.o. male who has BPH with urinary hesitancy returns for a TRUS.   -2 years bothersome LUTS -On doxazosin and finasteride for greater than 8-9 months. No improvement in his urinary symptoms on these medications.     Please see previous notes for details.     Height 5\' 10"  (1.778 m), weight (!) 216 lb (98 kg).   NED. A&Ox3.   No respiratory distress   Abd soft, NT, ND Normal phallus with bilateral descended testicles    Cystoscopy Procedure Note  Patient identification was confirmed, informed consent was obtained, and patient was prepped using Betadine solution.  Lidocaine jelly was administered per urethral meatus.     Pre-Procedure: - Inspection reveals a normal caliber urethral meatus.  Procedure: The flexible cystoscope was introduced without difficulty - No urethral strictures/lesions are present. -Mild lateral lobe enlargement prostate  -Elevated bladder neck - Bilateral ureteral orifices identified - Bladder mucosa  reveals no ulcers, tumors, or lesions. There was moderate lateral lobe enlargement.  - No bladder stones - Mild trabeculation with scattered cellules.   Retroflexion shows no intravesical median lobe   Post-Procedure: - Patient tolerated the procedure well    Prostate transrectal ultrasound sizing   Informed consent was obtained after discussing risks/benefits of the procedure.  A time out was performed to ensure correct patient identity.   Pre-Procedure: -Transrectal probe was placed without difficulty -Transrectal Ultrasound performed revealing a 29 gm prostate measuring 3.15 x 5.03 x 3.49 cm (length) -No significant hypoechoic or median lobe noted      Assessment/ Plan:  1. BPH with incomplete bladder emptying He was primarily interested in UroLift as an outlet procedure due to refractory symptoms.  Prior PVR 107 mL.  He would be a  candidate for UroLift as he does have mild lateral lobe enlargement and bladder neck elevation.  We discussed the procedure in detail and it was stressed there is no guarantee this will resolve his symptoms although UroLift does not preclude additional procedures for refractory symptoms.  The most common postoperative side effects of dysuria, frequency, urgency and hematuria were discussed  He indicated all questions were answered and desires to schedule  I, Selena Batten, am acting as a scribe for Dr. Nicki Reaper C. Daqwan Dougal,  I have reviewed the above documentation for accuracy and completeness, and I agree with the above.   Abbie Sons, MD

## 2020-02-17 ENCOUNTER — Ambulatory Visit (INDEPENDENT_AMBULATORY_CARE_PROVIDER_SITE_OTHER): Payer: Medicare Other | Admitting: Urology

## 2020-02-17 ENCOUNTER — Encounter: Payer: Self-pay | Admitting: Urology

## 2020-02-17 ENCOUNTER — Other Ambulatory Visit: Payer: Self-pay

## 2020-02-17 VITALS — Ht 70.0 in | Wt 216.0 lb

## 2020-02-17 DIAGNOSIS — N401 Enlarged prostate with lower urinary tract symptoms: Secondary | ICD-10-CM

## 2020-02-18 LAB — MICROSCOPIC EXAMINATION: Bacteria, UA: NONE SEEN

## 2020-02-18 LAB — URINALYSIS, COMPLETE
Bilirubin, UA: NEGATIVE
Glucose, UA: NEGATIVE
Ketones, UA: NEGATIVE
Leukocytes,UA: NEGATIVE
Nitrite, UA: NEGATIVE
Protein,UA: NEGATIVE
RBC, UA: NEGATIVE
Specific Gravity, UA: 1.025 (ref 1.005–1.030)
Urobilinogen, Ur: 0.2 mg/dL (ref 0.2–1.0)
pH, UA: 5 (ref 5.0–7.5)

## 2020-02-20 ENCOUNTER — Encounter: Payer: Self-pay | Admitting: Urology

## 2020-02-20 LAB — CULTURE, URINE COMPREHENSIVE

## 2020-03-07 ENCOUNTER — Other Ambulatory Visit: Payer: Self-pay | Admitting: Radiology

## 2020-03-07 DIAGNOSIS — N401 Enlarged prostate with lower urinary tract symptoms: Secondary | ICD-10-CM

## 2020-03-07 DIAGNOSIS — R339 Retention of urine, unspecified: Secondary | ICD-10-CM

## 2020-03-13 ENCOUNTER — Other Ambulatory Visit: Payer: Self-pay | Admitting: Radiology

## 2020-03-13 ENCOUNTER — Other Ambulatory Visit: Payer: Self-pay

## 2020-03-13 ENCOUNTER — Other Ambulatory Visit: Payer: Medicare Other

## 2020-03-13 DIAGNOSIS — N401 Enlarged prostate with lower urinary tract symptoms: Secondary | ICD-10-CM

## 2020-03-13 DIAGNOSIS — R339 Retention of urine, unspecified: Secondary | ICD-10-CM

## 2020-03-14 ENCOUNTER — Encounter
Admission: RE | Admit: 2020-03-14 | Discharge: 2020-03-14 | Disposition: A | Discharge status: Home / Self Care | Payer: Medicare Other | Source: Ambulatory Visit | Attending: Urology | Admitting: Urology

## 2020-03-14 HISTORY — DX: Personal history of urinary calculi: Z87.442

## 2020-03-14 HISTORY — DX: Headache, unspecified: R51.9

## 2020-03-14 NOTE — Patient Instructions (Addendum)
Your procedure is scheduled on: 03-21-20 TUESDAY Report to Same Day Surgery 2nd floor medical mall [facility] Entrance-take elevator on left to 2nd floor.  Check in with surgery information desk.) To find out your arrival time please call [phone] between 1PM - 3PM on 03-20-20 MONDAY  Remember: Instructions that are not followed completely may result in serious medical risk, up to and including death, or upon the discretion of your surgeon and anesthesiologist your surgery may need to be rescheduled.    _x___ 1. Do not eat food after midnight the night before your procedure. NO GUM OR CANDY AFTER MIDNIGHT. You may drink clear liquids up to 2 hours before you are scheduled to arrive at the hospital for your procedure.  Do not drink clear liquids within 2 hours of your scheduled arrival to the hospital.  Clear liquids include  --Water or Apple juice without pulp  -- Gatorade  --Black Coffee or Clear Tea (No milk, no creamers, do not add anything to the coffee or Tea-OK TO ADD SUGAR)    __x__ 2. No Alcohol for 24 hours before or after surgery.   __x__3. No Smoking or e-cigarettes for 24 prior to surgery.  Do not use any chewable tobacco products for at least 6 hour prior to surgery   ____  4. Bring all medications with you on the day of surgery if instructed.    __x__ 5. Notify your doctor if there is any change in your medical condition     (cold, fever, infections).    x___6. On the morning of surgery brush your teeth with toothpaste and water.  You may rinse your mouth with mouth wash if you wish.  Do not swallow any toothpaste or mouthwash.   Do not wear jewelry, make-up, hairpins, clips or nail polish.  Do not wear lotions, powders, or perfumes. You may wear deodorant.  Do not shave 48 hours prior to surgery. Men may shave face and neck.  Do not bring valuables to the hospital.    [facility] is not responsible for any belongings or valuables.               Contacts,  dentures or bridgework may not be worn into surgery.  Leave your suitcase in the car. After surgery it may be brought to your room.  For patients admitted to the hospital, discharge time is determined by your treatment team.  _  Patients discharged the day of surgery will not be allowed to drive home.  You will need someone to drive you home and stay with you the night of your procedure.    Please read over the following fact sheets that you were given:   [facility] Preparing for Surgery   _x___ TAKE THE FOLLOWING MEDICATION THE MORNING OF SURGERY WITH A SMALL SIP OF WATER. These include:  1. METOPROLOL (LOPRESSOR)  2. ARICEPT (DONEPEZIL)  3. PROSCAR (FINASTERIDE)  4.  5.  6.  ____Fleets enema or Magnesium Citrate as directed.   ____ Use CHG Soap or sage wipes as directed on instruction sheet   ____ Use inhalers on the day of surgery and bring to hospital day of surgery  ____ Stop Metformin and Janumet 2 days prior to surgery.    ____ Take 1/2 of usual insulin dose the night before surgery and none on the morning surgery.   _x___ Follow recommendations from Cardiologist, Pulmonologist or PCP regarding stopping Aspirin, Coumadin, Plavix ,Eliquis, Effient, or Pradaxa, and Pletal-INSTRUCTED TO CALL AMY  AT DR STOIOFF'S OFFICE REGARDING EXACTLY WHEN TO STOP HIS WARFARIN (COUMADIN)  X____Stop Anti-inflammatories such as Advil, Aleve, Ibuprofen, Motrin, Naproxen, Naprosyn, Goodies powders or aspirin products NOW- OK to take Tylenol   ____ Stop supplements until after surgery.    ____ Bring C-Pap to the hospital.

## 2020-03-14 NOTE — Pre-Procedure Instructions (Signed)
Progress Notes - documented in this encounter Henry Park, Henry Park - 01/10/2020 9:45 AM EDT Formatting of this note is different from the original. Established Patient Visit   Chief Complaint: Chief Complaint  Patient presents with   Follow-up  stress test  Date of Service: 01/10/2020 Date of Birth: 18-Feb-1932 PCP: Velna Ochs, MD  History of Present Illness: Henry Park is a 86 y.o.male patient who returns for chest pain, shortness of breath, and to review results of ETT Myoview. The patient has a history of paroxysmal atrial fibrillation, on warfarin, essential hypertension, hyperlipidemia, and CVA. The patient reports occasional episodes of chest discomfort described as an "'annoyance," occurring at rest, not worse with exertion, without radiation or shortness of breath. He also reported exertional shortness of breath when he would overexert himself. He reports that neither of these symptoms inhibit his daily activities. He has chronic peripheral edema. He denies palpitations or heart racing. He denies presyncope or syncope. He underwent ETT Myoview on 01/05/2020, exercising for 5 minutes on a Bruce protocol, without experiencing chest pain or ECG changes. Gated scintigraphy revealed LVEF 66%. SPECT imaging revealed no evidence of scar or ischemia. 2D echocardiogram 05/25/2019 revealed normal left ventricular function, with LVEF greater than 55%, with mild mitral and tricuspid regurgitation.  The patient has paroxysmal atrial fibrillation, chads vasc score of 4, on warfarin for stroke prevention. The patient denies hematochezia, hematuria, or melena.  The patient has essential hypertension, blood pressure adequately controlled today, currently on metoprolol tartrate, which is well-tolerated without apparent side effects. The patient follows a low-sodium, no added salt diet.  The patient has hyperlipidemia, on atorvastatin, which is well-tolerated without apparent side effects,  followed by his primary care provider. LDL cholesterol was 67 on 05/03/2019.  Past Medical and Surgical History  Past Medical History Past Medical History:  Diagnosis Date   A-fib (CMS-HCC)   Allergic state   Arthritis   BPH (benign prostatic hypertrophy)  with bladder outlet obstruction   Cataract cortical, senile   Cerebrovascular disease, unspecified   CHF (congestive heart failure) (CMS-HCC)   Colitis, unspecified   Diastolic dysfunction   History of chicken pox   Hx-TIA (transient ischemic attack)  09/30/08 most recent   Hyperlipidemia   Mild cognitive impairment   Psoriasis   Stroke (CMS-HCC)   Tubular adenoma of colon, unspecified 06/19/2015   Tubulovillous adenoma of colon, 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   Medications and Allergies  Current Medications  Current Outpatient Medications  Medication Sig Dispense Refill   atorvastatin (LIPITOR) 10 MG tablet TAKE 1 TABLET DAILY 90 tablet 3   cyanocobalamin (VITAMIN B12) 1000 MCG tablet Take 1,000 mcg by mouth once daily.   donepeziL (ARICEPT) 10 MG tablet Take 1 tablet (10 mg total) by mouth once daily 90 tablet 3   doxazosin (CARDURA) 4 MG tablet Take 1 tablet (4 mg total) by mouth nightly 90 tablet 3   finasteride (PROSCAR) 5 mg tablet Take 1 tablet (5 mg total) by mouth once daily 90 tablet 1   metoprolol tartrate (LOPRESSOR) 50 MG tablet TAKE 1 TABLET THREE TIMES A DAY 270 tablet 3   warfarin (COUMADIN) 3 MG tablet Take 1 tablet (3 mg total) by mouth once daily 90 tablet 1   No current facility-administered medications for this visit.   Allergies: Patient has no known allergies.  Social and Family History  Social History reports that  he quit smoking about 60 years ago. His smoking use included cigarettes. He has a 54.00 pack-year smoking history. He has never used smokeless tobacco.  He reports that he does not drink alcohol and does not use drugs.  Family History Family History  Problem Relation Age of Onset   High blood pressure (Hypertension) Mother   Colon cancer Paternal Uncle   Review of Systems   Review of Systems: The patient reports mild nonexertional chest pain, with chronic exertional shortness of breath, without orthopnea, paroxysmal nocturnal dyspnea, with chronic pedal edema, without palpitations, heart racing, presyncope, syncope. Review of 10 Systems is negative except as described above.  Physical Examination   Vitals:BP 138/60   Pulse 67   Ht 175.3 cm (5\' 9" )   Wt 98 kg (216 lb)   BMI 31.90 kg/m  Ht:175.3 cm (5\' 9" ) Wt:98 kg (216 lb) YPP:JKDT surface area is 2.18 meters squared. Body mass index is 31.9 kg/m.  General: Alert and oriented. Well-appearing. No acute distress. HEENT: Pupils equally reactive to light and accomodation  Neck: Supple, no JVD Lungs: Normal effort of breathing; clear to auscultation bilaterally; no wheezes, rales, rhonchi Heart: Regular rate and rhythm. No murmur, rub, or gallop Abdomen: nondistended Extremities: no cyanosis, clubbing, or edema Peripheral Pulses: 2+ radial  Skin: Warm, dry, no diaphoresis  Assessment   86 y.o. male with  1. Paroxysmal atrial fibrillation (CMS-HCC)  2. Chest pain with low risk for cardiac etiology  3. SOB (shortness of breath) on exertion   86 year old, with multiple cardiovascular risk factors, with intermittent nonexertional chest pain, described as an "annoyance," with chronic exertional dyspnea. 2D echocardiogram revealed normal left ventricular function, with LVEF greater than 55%, with mild mitral and tricuspid regurgitation. The patient underwent ETT Myoview without chest pain, ECG changes, or evidence of scar or ischemia. The patient states that his symptoms do not interfere with his daily activities. The patient has essential hypertension with adequate control of blood  pressure on current BP medications.  Plan   1. Continue current medications 2. Recommend low sodium and heart healthy diet 3. No further cardiac diagnostics recommended at this time 4. Continue atorvastatin for hyperlipidemia management 5. Return to clinic for follow-up in 3 months, sooner if symptoms worsen  No orders of the defined types were placed in this encounter.  Return in about 3 months (around 04/11/2020). I personally performed the service, non-incident to. (WP)   ANNA Shirlee More, PA-C    Electronically signed by Henry Park, [place] at 01/10/2020 12:45 PM EDT  Plan of Treatment - documented as of this encounter Upcoming Encounters Upcoming Encounters  Date Type Specialty Care Team Description  01/20/2020 Ancillary Orders Lab    04/11/2020 Office Visit Cardiology Henry Cowman, MD  [place]  [facility]  [place], [place] [address]  [phone]  [phone] ([address]    Henry Park, [place]  [facility]  [place], [place] [address]  [phone]  [phone] (Fax)    06/13/2020 Ancillary Orders Lab Velna Ochs, MD  [address] [place]  [place], [place] [address]  [phone]  [phone] (Fax)    06/20/2020 Office Visit Internal Medicine Velna Ochs, [place]  [place], [place] [address]  [phone]  [phone] (Fax)    Goals - documented as of this encounter Goal Patient Goal Type Associated Problems Recent Progress Patient-Stated? Author  Patient Goals  General   Yes [place] Breath, [place]  Note:   Formatting  of this note might be different from the original. None Currently   Maintain health/healthy lifestyle  Lifestyle  On track (05/10/2019 10:44 AM EDT) Yes Carren Rang, CMA  Visit Diagnoses - documented in this encounter Diagnosis  Paroxysmal atrial fibrillation (CMS-HCC) - Primary  Atrial fibrillation   Chest pain with low  risk for cardiac etiology   SOB (shortness of breath) on exertion  Shortness of breath   Images Patient Demographics  Patient Address Communication Language Race / Ethnicity Marital Status  [place], [place] [address] [phone] [facility]) [phone] (Mobile) English (Preferred) White / Not Hispanic or Latino Married  Patient Contacts  Contact Name Contact Address Communication Relationship to Patient  [address] [place], [place] [address] [phone] (Home) Spouse, Emergency Contact  Document Information  Primary Care Provider Other Service Providers Document Coverage Dates  Velna Ochs, MD (Nov. 14, 2018November 14, 2018 - Present) [phone] (Work) [phone] (Fax) [place], [place] [address] Internal Medicine    Jun. 14, 2021June 14, 2021   Custodian Shepherd [address] [place],  [address]   Encounter Providers Encounter Date  Henry Park, [place] (Attending) [phone] (Work) [phone] (Fax) [place],  [address] Cardiovascular Disease Jun. 14, 2021June 14, 2021    Show All Sections

## 2020-03-14 NOTE — Pre-Procedure Instructions (Signed)
NM myocardial perfusion SPECT multiple (stress and rest)  Impression   1. Negative ETT  2. Normal left ventricular function  3. Normal wall motion  4. No evidence for scar or ischemia Narrative  CARDIOLOGY DEPARTMENT  [facility]  [place], [place], [address] [address]  [phone]   Procedure: Exercise Myocardial Perfusion Imaging   ONE day procedure   Indication: SOB (shortness of breath) on exertion  Plan: NM myocardial perfusion SPECT multiple (stress    and rest), ECG stress test only   Chest pain with high risk for cardiac etiology  Plan: NM myocardial perfusion SPECT multiple (stress    and rest), ECG stress test only   Ordering Physician:   Dr. Isaias Cowman    Clinical History:  86 y.o. year old male  Vitals: Height: 69 in Weight: 216 lb  Cardiac risk factors include:    CVA, AFIB, CHF, Hyperlipidemia and HTN    Procedure:  The patient performed treadmill exercise using a Bruce protocol for 5:00  minutes. The exercise test was stopped due to fatigue. Blood pressure  response was normal. The patient did not develop any symptoms other than  fatigue during the procedure with knee weakness.   Rest HR: 68bpm  Rest BP: 124/13mmHg  Max HR: 142bpm  Max BP: 174/20mmHg  Mets:   6.50  % MAX HR:  105%   Stress Test Administered by: Oswald Hillock, CMA   ECG Interpretation:  Rest ECG: atrial fibrillation, none  Stress ECG: atrial fibrillation,  Recovery ECG: atrial fibrillation  ECG Interpretation: negative, no ECG changes.    Administrations This Visit   technetium Tc56m sestamibi (CARDIOLITE) injection 60.63 millicurie   Admin Date  01/05/2020 Action  Given Dose  01.60 millicurie Route  Intravenous Administered By  Herbert Seta, CNMT    technetium Tc69m sestamibi (CARDIOLITE) injection 10.93 millicurie   Admin Date  01/05/2020 Action  Given Dose  23.55 millicurie Route   Intravenous Administered By  Herbert Seta, CNMT       Gated post-stress perfusion imaging was performed 30 minutes after stress.  Rest images were performed 30 minutes after injection.   Gated LV Analysis:   Summary of LV Perfusion: Normal   Summary of LV Function: Normal   TID: 0.97   LVEF= 66%   FINDINGS:  Regional wall motion: reveals normal myocardial thickening and wall  motion.  The overall quality of the study is good.   Artifacts noted: no  Left ventricular cavity: normal.   Perfusion Analysis: SPECT images demonstrate homogeneous tracer  distribution throughout the myocardium. Defect type: Normal Specimen Collected: -- Last Resulted: 01/06/20 2:31 PM  Received From: [place]  Result Received: 02/17/20 10:52 AM  Encounter Summary

## 2020-03-17 ENCOUNTER — Other Ambulatory Visit: Payer: Self-pay

## 2020-03-17 ENCOUNTER — Other Ambulatory Visit
Admission: RE | Admit: 2020-03-17 | Discharge: 2020-03-17 | Disposition: A | Discharge status: Home / Self Care | Payer: Medicare Other | Source: Ambulatory Visit | Attending: Urology | Admitting: Urology

## 2020-03-17 DIAGNOSIS — Z01812 Encounter for preprocedural laboratory examination: Secondary | ICD-10-CM | POA: Diagnosis present

## 2020-03-17 DIAGNOSIS — Z20822 Contact with and (suspected) exposure to covid-19: Secondary | ICD-10-CM | POA: Insufficient documentation

## 2020-03-17 LAB — BASIC METABOLIC PANEL
Anion gap: 7 (ref 5–15)
BUN: 19 mg/dL (ref 8–23)
CO2: 30 mmol/L (ref 22–32)
Calcium: 9 mg/dL (ref 8.9–10.3)
Chloride: 106 mmol/L (ref 98–111)
Creatinine, Ser: 0.79 mg/dL (ref 0.61–1.24)
GFR calc Af Amer: >60 mL/min (ref >60)
GFR calc non Af Amer: >60 mL/min (ref >60)
Glucose, Bld: 164 mg/dL — ABNORMAL HIGH (ref 70–99)
Potassium: 4.5 mmol/L (ref 3.5–5.1)
Sodium: 143 mmol/L (ref 135–145)

## 2020-03-17 LAB — CBC
HCT: 40.5 % (ref 39.0–52.0)
Hemoglobin: 13.1 g/dL (ref 13.0–17.0)
MCH: 31.4 pg (ref 26.0–34.0)
MCHC: 32.3 g/dL (ref 30.0–36.0)
MCV: 97.1 fL (ref 80.0–100.0)
Platelets: 128 10*3/uL — ABNORMAL LOW (ref 150–400)
RBC: 4.17 MIL/uL — ABNORMAL LOW (ref 4.22–5.81)
RDW: 13.5 % (ref 11.5–15.5)
WBC: 9.6 10*3/uL (ref 4.0–10.5)
nRBC: 0 % (ref 0.0–0.2)

## 2020-03-17 LAB — SARS CORONAVIRUS 2 (TAT 6-24 HRS): SARS Coronavirus 2: NEGATIVE

## 2020-03-17 NOTE — Progress Notes (Signed)
[facility] Perioperative Services  Pre-Admission/Anesthesia Testing Clinical Review  Date: 03/17/20  Patient Demographics:  Name: Henry Park DOB:   1931-11-12 MRN:   [id number]  Planned Surgical Procedure(s):    Case: [id number] Date/Time: 03/21/20 0939   Procedure: CYSTOSCOPY WITH INSERTION OF UROLIFT (N/A )   Anesthesia type: Monitor Anesthesia Care   Pre-op diagnosis: benign prostatic hypertrophy with lower urinary tract symptoms, incomplete bladder emptying   Location: [facility] OR ROOM 10 / [facility] ORS FOR ANESTHESIA GROUP   Surgeons: Abbie Sons, MD     NOTE: Available PAT nursing documentation and vital signs have been reviewed. Clinical nursing staff has updated patient's PMH/PSHx, current medication list, and drug allergies/intolerances to ensure comprehensive history available to assist in medical decision making as it pertains to the aforementioned surgical procedure and anticipated anesthetic course.   Clinical Discussion:  Henry Park is a 86 y.o. male who is submitted for pre-surgical anesthesia review and clearance prior to him undergoing the above procedure. Patient is a Former Smoker (40 pack years; quit 02/1960). Pertinent PMH includes: A. fib, CHF, HLD, CVA (2000), GERD (on daily PPI), OSA, OA, dementia.  Patient is followed by cardiology Saralyn Pilar, MD). He was last seen in the cardiology clinic on 01/10/2020; notes reviewed.  At the time of his clinic visit, patient reported occasional episodes of chest "discomfort" that he described as being more of an "annoyance".  Symptom noted to occur at rest, with no exacerbation noted with exertion.  Patient denied radiation of the pain or associated shortness of breath.  Exam reveals chronic peripheral edema.  Patient denied any orthopnea, PND, palpitations, vertiginous symptoms, or presyncope/syncope.  TTE in 04/2019 revealed normal left ventricular function with an EF > 55%.  ETT Myoview in 12/2019  revealed an LVEF of 66% and no evidence of ischemia (see below for full interpretation). CHA2DS2-VASc Score = 4.  Patient currently on daily warfarin for CVA prevention; no hematochezia, melena, or hematuria.  Hypertension well controlled on beta-blocker therapy.  Patient on daily statin for HLD.  Given patient's age and PMH, presurgical cardiac clearance was sought performing surgeon's office.  Per cardiology, patient may proceed with surgery/procedure with a LOW risk stratification.  Again patient is on daily anticoagulation.  He has been instructed on recommendations for holding his warfarin for 5 days prior to his procedure. The patient has been instructed that his last dose of his anticoagulant will be on 03/16/2020.  Dr. Saralyn Pilar notes that patient will not require an enoxaparin bridge at this point.  From a cardiology standpoint, patient optimized for surgery.  He denies previous intra-operative complications with anesthesia. He underwent a general anesthetic course here (ASA III) in 05/2015 with no documented complications.   Vitals with BMI 02/17/2020 01/12/2020 06/19/2015  Height 5\' 10"  5\' 9"  -  Weight 216 lbs 216 lbs -  BMI 56.31 49.70 -  Systolic - 263 785  Diastolic - 78 72  Pulse - 89 73    Providers/Specialists:   NOTE: Primary physician provider listed below. Patient may have been seen by APP or partner within same practice.   PROVIDER ROLE LAST Claud Kelp, MD Urology (Surgeon) 02/17/2020  Baxter Hire, MD Primary Care Provider 05/17/20217  Isaias Cowman, MD Cardiology 01/10/2020   Allergies:  Patient has no known allergies.  Current Home Medications:   No current facility-administered medications for this encounter.   Mar[place] Kitchen atorvastatin (LIPITOR) 10 MG tablet  . donepezil (ARICEPT) 10 MG tablet  .  doxazosin (CARDURA) 4 MG tablet  . finasteride (PROSCAR) 5 MG tablet  . hyoscyamine (LEVSIN SL) 0.125 MG SL tablet  . metoprolol (LOPRESSOR) 50 MG tablet   . pantoprazole (PROTONIX) 40 MG tablet  . vitamin B-12 (CYANOCOBALAMIN) 1000 MCG tablet  . warfarin (COUMADIN) 3 MG tablet   History:   Past Medical History:  Diagnosis Date  . Arthritis   . BPH (benign prostatic hyperplasia)   . Cerebrovascular disease   . CHF (congestive heart failure) ([place])   . Colitis   . Dementia ([place])   . Dysrhythmia    atrial fibrillation  . GERD (gastroesophageal reflux disease)   . Headache    H/O MIGRAINES  . History of kidney stones    H/O  . Hyperlipidemia   . Psoriasis   . Shortness of breath dyspnea   . Skin cancer   . Sleep apnea   . Stroke [facility]) 2000   Past Surgical History:  Procedure Laterality Date  . COLONOSCOPY    . COLONOSCOPY WITH PROPOFOL N/A 06/19/2015   Procedure: COLONOSCOPY WITH PROPOFOL;  Surgeon: Hulen Luster, MD;  Location: [facility] ENDOSCOPY;  Service: Gastroenterology;  Laterality: N/A;   No family history on file. Social History   Tobacco Use  . Smoking status: Former Smoker    Packs/day: 2.00    Years: 20.00    Pack years: 40.00    Types: Cigarettes    Quit date: 03/14/1960    Years since quitting: 60.0  . Smokeless tobacco: Never Used  Vaping Use  . Vaping Use: Never used  Substance Use Topics  . Alcohol use: Never  . Drug use: Never    Pertinent Clinical Results:  LABS: Labs reviewed: Acceptable for surgery.  Hospital Outpatient Visit on 03/17/2020  Component Date Value Ref Range Status  . Sodium 03/17/2020 143  135 - 145 mmol/L Final  . Potassium 03/17/2020 4.5  3.5 - 5.1 mmol/L Final  . Chloride 03/17/2020 106  98 - 111 mmol/L Final  . CO2 03/17/2020 30  22 - 32 mmol/L Final  . Glucose, Bld 03/17/2020 164* 70 - 99 mg/dL Final   Glucose reference range applies only to samples taken after fasting for at least 8 hours.  . BUN 03/17/2020 19  8 - 23 mg/dL Final  . Creatinine, Ser 03/17/2020 0.79  0.61 - 1.24 mg/dL Final  . Calcium 03/17/2020 9.0  8.9 - 10.3 mg/dL Final  . GFR calc non Af Amer 03/17/2020  >60  >60 mL/min Final  . GFR calc Af Amer 03/17/2020 >60  >60 mL/min Final  . Anion gap 03/17/2020 7  5 - 15 Final   Performed at [facility], [address], [place], [place] [address]  . WBC 03/17/2020 9.6  4.0 - 10.5 K/uL Final  . RBC 03/17/2020 4.17* 4.22 - 5.81 MIL/uL Final  . Hemoglobin 03/17/2020 13.1  13.0 - 17.0 g/dL Final  . HCT 03/17/2020 40.5  39 - 52 % Final  . MCV 03/17/2020 97.1  80.0 - 100.0 fL Final  . MCH 03/17/2020 31.4  26.0 - 34.0 pg Final  . MCHC 03/17/2020 32.3  30.0 - 36.0 g/dL Final  . RDW 03/17/2020 13.5  11.5 - 15.5 % Final  . Platelets 03/17/2020 128* 150 - 400 K/uL Final  . nRBC 03/17/2020 0.0  0.0 - 0.2 % Final   Performed at [facility], [address] [place], [place] [address]    ECG: Date: 01/05/2020 Rate: 68 bpm Rhythm: atrial fibrillation  ST segment and T wave changes: No evidence of acute ST segment elevation or depression Comparison: Similar to previous tracing obtained on 05/10/2019 NOTE: Tracing obtained at [facility]; unable for review. Above based on cardiologist's interpretation.    IMAGING / PROCEDURES: LEXISCAN done on 01/06/2020 1. LVEF 66% 2. Regional wall motion reveals normal myocardial thickening and wall motion. 3. The overall quality of the study is good 4. No artifacts noted 5. Left ventricular cavity normal 6. No evidence of scar or ischemia. 7. Negative ETT  ECHOCARDIOGRAM done on 05/27/2019 1. LVEF 55% 2. Normal LV systolic function 3. Normal RV systolic function 4. Mild to moderate MR, mild PR, moderate TR, trivial AR 5. No valvular stenosis   Impression and Plan:  Henry Park has been referred for pre-anesthesia review and clearance prior him undergoing the planned anesthetic and procedural courses. Available labs, pertinent testing, and imaging results were personally reviewed by me. This patient has been appropriately cleared by cardiology.   Based on clinical review performed  today (03/17/20), barring any significant acute changes in the patient's overall condition, it is anticipated that he will be able to proceed with the planned surgical intervention. Any acute changes in clinical condition may necessitate his procedure being postponed and/or cancelled. Pre-surgical instructions were reviewed with the patient during his PAT appointment and questions were fielded by PAT clinical staff.  Honor Loh, MSN, APRN, FNP-C, CEN [facility]  Peri-operative Services Nurse Practitioner Phone: [phone] 03/17/20 3:40 PM  NOTE: This note has been prepared using Dragon dictation software. Despite my best ability to proofread, there is always the potential that unintentional transcriptional errors may still occur from this process.

## 2020-03-18 LAB — CULTURE, URINE COMPREHENSIVE

## 2020-03-19 ENCOUNTER — Telehealth: Payer: Self-pay | Admitting: Urology

## 2020-03-19 MED ORDER — CEFUROXIME AXETIL 250 MG PO TABS
250.0000 mg | ORAL_TABLET | Freq: Two times a day (BID) | ORAL | 0 refills | Status: AC
Start: 2020-03-19 — End: 2020-03-24

## 2020-03-19 NOTE — Telephone Encounter (Signed)
Preop urine culture grew a low level of Proteus which is most likely colonized.  Please check with patient to make sure he does not have a symptomatic UTI.  If no symptoms I sent an Rx cefuroxime to pharmacy.  If he can start tomorrow will be fine for procedure Tuesday

## 2020-03-20 NOTE — Telephone Encounter (Signed)
Notified patient of script sent to pharmacy. Patient states he had burning "a couple of days ago" but is currently asymptomatic. Per Dr Bernardo Heater, will proceed with surgery as planned.

## 2020-03-21 ENCOUNTER — Encounter
Admission: RE | Disposition: A | Discharge status: Home / Self Care | Payer: Self-pay | Source: Home / Self Care | Attending: Urology

## 2020-03-21 ENCOUNTER — Ambulatory Visit
Admission: RE | Admit: 2020-03-21 | Discharge: 2020-03-21 | Disposition: A | Discharge status: Home / Self Care | Payer: Medicare Other | Attending: Urology | Admitting: Urology

## 2020-03-21 ENCOUNTER — Ambulatory Visit: Payer: Medicare Other | Admitting: Urgent Care

## 2020-03-21 ENCOUNTER — Other Ambulatory Visit: Payer: Self-pay

## 2020-03-21 ENCOUNTER — Encounter: Payer: Self-pay | Admitting: Urology

## 2020-03-21 DIAGNOSIS — I509 Heart failure, unspecified: Secondary | ICD-10-CM | POA: Diagnosis not present

## 2020-03-21 DIAGNOSIS — R338 Other retention of urine: Secondary | ICD-10-CM | POA: Diagnosis not present

## 2020-03-21 DIAGNOSIS — R351 Nocturia: Secondary | ICD-10-CM | POA: Insufficient documentation

## 2020-03-21 DIAGNOSIS — E785 Hyperlipidemia, unspecified: Secondary | ICD-10-CM | POA: Diagnosis not present

## 2020-03-21 DIAGNOSIS — I4891 Unspecified atrial fibrillation: Secondary | ICD-10-CM | POA: Insufficient documentation

## 2020-03-21 DIAGNOSIS — K219 Gastro-esophageal reflux disease without esophagitis: Secondary | ICD-10-CM | POA: Insufficient documentation

## 2020-03-21 DIAGNOSIS — R3914 Feeling of incomplete bladder emptying: Secondary | ICD-10-CM

## 2020-03-21 DIAGNOSIS — Z8673 Personal history of transient ischemic attack (TIA), and cerebral infarction without residual deficits: Secondary | ICD-10-CM | POA: Insufficient documentation

## 2020-03-21 DIAGNOSIS — R3915 Urgency of urination: Secondary | ICD-10-CM | POA: Insufficient documentation

## 2020-03-21 DIAGNOSIS — F0390 Unspecified dementia without behavioral disturbance: Secondary | ICD-10-CM | POA: Insufficient documentation

## 2020-03-21 DIAGNOSIS — Z7901 Long term (current) use of anticoagulants: Secondary | ICD-10-CM | POA: Diagnosis not present

## 2020-03-21 DIAGNOSIS — R3911 Hesitancy of micturition: Secondary | ICD-10-CM | POA: Diagnosis not present

## 2020-03-21 DIAGNOSIS — Z79899 Other long term (current) drug therapy: Secondary | ICD-10-CM | POA: Diagnosis not present

## 2020-03-21 DIAGNOSIS — G4730 Sleep apnea, unspecified: Secondary | ICD-10-CM | POA: Insufficient documentation

## 2020-03-21 DIAGNOSIS — R339 Retention of urine, unspecified: Secondary | ICD-10-CM

## 2020-03-21 DIAGNOSIS — R3912 Poor urinary stream: Secondary | ICD-10-CM | POA: Diagnosis not present

## 2020-03-21 DIAGNOSIS — J45909 Unspecified asthma, uncomplicated: Secondary | ICD-10-CM | POA: Insufficient documentation

## 2020-03-21 DIAGNOSIS — Z87891 Personal history of nicotine dependence: Secondary | ICD-10-CM | POA: Diagnosis not present

## 2020-03-21 DIAGNOSIS — R350 Frequency of micturition: Secondary | ICD-10-CM | POA: Diagnosis not present

## 2020-03-21 DIAGNOSIS — R39198 Other difficulties with micturition: Secondary | ICD-10-CM | POA: Insufficient documentation

## 2020-03-21 DIAGNOSIS — N401 Enlarged prostate with lower urinary tract symptoms: Secondary | ICD-10-CM

## 2020-03-21 DIAGNOSIS — M1990 Unspecified osteoarthritis, unspecified site: Secondary | ICD-10-CM | POA: Diagnosis not present

## 2020-03-21 HISTORY — PX: CYSTOSCOPY WITH INSERTION OF UROLIFT: SHX6678

## 2020-03-21 LAB — PROTIME-INR
INR: 1.2 (ref 0.8–1.2)
Prothrombin Time: 14.3 seconds (ref 11.4–15.2)

## 2020-03-21 SURGERY — CYSTOSCOPY WITH INSERTION OF UROLIFT
Anesthesia: General

## 2020-03-21 MED ORDER — DEXAMETHASONE SODIUM PHOSPHATE 10 MG/ML IJ SOLN
INTRAMUSCULAR | Status: DC | PRN
  Administered 2020-03-21: 10 mg via INTRAVENOUS

## 2020-03-21 MED ORDER — FENTANYL CITRATE (PF) 100 MCG/2ML IJ SOLN
INTRAMUSCULAR | Status: DC | PRN
Start: 2020-03-21 — End: 2020-03-21
  Administered 2020-03-21 (×2): 25 ug via INTRAVENOUS

## 2020-03-21 MED ORDER — ONDANSETRON HCL 4 MG/2ML IJ SOLN
INTRAMUSCULAR | Status: DC | PRN
  Administered 2020-03-21: 4 mg via INTRAVENOUS

## 2020-03-21 MED ORDER — FENTANYL CITRATE (PF) 100 MCG/2ML IJ SOLN: 25.0000 ug | INTRAMUSCULAR | Status: DC | PRN

## 2020-03-21 MED ORDER — ONDANSETRON HCL 4 MG/2ML IJ SOLN: 4.0000 mg | Freq: Once | INTRAMUSCULAR | Status: DC | PRN

## 2020-03-21 MED ORDER — CEFAZOLIN SODIUM-DEXTROSE 2-4 GM/100ML-% IV SOLN
INTRAVENOUS | Status: AC
  Filled 2020-03-21: qty 100

## 2020-03-21 MED ORDER — LIDOCAINE HCL (CARDIAC) PF 100 MG/5ML IV SOSY
PREFILLED_SYRINGE | INTRAVENOUS | Status: DC | PRN
  Administered 2020-03-21: 50 mg via INTRAVENOUS

## 2020-03-21 MED ORDER — LIDOCAINE HCL (PF) 2 % IJ SOLN
INTRAMUSCULAR | Status: AC
  Filled 2020-03-21: qty 5

## 2020-03-21 MED ORDER — CEFAZOLIN SODIUM-DEXTROSE 2-4 GM/100ML-% IV SOLN
2.0000 g | INTRAVENOUS | Status: AC
  Administered 2020-03-21: 2 g via INTRAVENOUS

## 2020-03-21 MED ORDER — ORAL CARE MOUTH RINSE: 15.0000 mL | Freq: Once | OROMUCOSAL | Status: AC

## 2020-03-21 MED ORDER — FAMOTIDINE 20 MG PO TABS: 20.0000 mg | ORAL_TABLET | Freq: Once | ORAL | Status: AC

## 2020-03-21 MED ORDER — FAMOTIDINE 20 MG PO TABS
ORAL_TABLET | ORAL | Status: AC
  Administered 2020-03-21: 20 mg via ORAL
  Filled 2020-03-21: qty 1

## 2020-03-21 MED ORDER — CHLORHEXIDINE GLUCONATE 0.12 % MT SOLN: 15.0000 mL | Freq: Once | OROMUCOSAL | Status: AC

## 2020-03-21 MED ORDER — PROPOFOL 10 MG/ML IV BOLUS
INTRAVENOUS | Status: AC
  Filled 2020-03-21: qty 20

## 2020-03-21 MED ORDER — FENTANYL CITRATE (PF) 100 MCG/2ML IJ SOLN
INTRAMUSCULAR | Status: AC
  Filled 2020-03-21: qty 2

## 2020-03-21 MED ORDER — DEXAMETHASONE SODIUM PHOSPHATE 10 MG/ML IJ SOLN
INTRAMUSCULAR | Status: AC
  Filled 2020-03-21: qty 1

## 2020-03-21 MED ORDER — LACTATED RINGERS IV SOLN: INTRAVENOUS | Status: DC

## 2020-03-21 MED ORDER — PROPOFOL 10 MG/ML IV BOLUS
INTRAVENOUS | Status: DC | PRN
  Administered 2020-03-21: 20 mg via INTRAVENOUS
  Administered 2020-03-21: 30 mg via INTRAVENOUS
  Administered 2020-03-21: 20 mg via INTRAVENOUS

## 2020-03-21 MED ORDER — ONDANSETRON HCL 4 MG/2ML IJ SOLN
INTRAMUSCULAR | Status: AC
  Filled 2020-03-21: qty 2

## 2020-03-21 MED ORDER — CHLORHEXIDINE GLUCONATE 0.12 % MT SOLN
OROMUCOSAL | Status: AC
  Administered 2020-03-21: 15 mL via OROMUCOSAL
  Filled 2020-03-21: qty 15

## 2020-03-21 MED ORDER — PROPOFOL 500 MG/50ML IV EMUL
INTRAVENOUS | Status: DC | PRN
  Administered 2020-03-21: 20 ug/kg/min via INTRAVENOUS

## 2020-03-21 SURGICAL SUPPLY — 16 items
BAG DRAIN CYSTO-URO LG1000N (MISCELLANEOUS) ×3 IMPLANT
BRUSH SCRUB EZ  4% CHG (MISCELLANEOUS) ×2
BRUSH SCRUB EZ 4% CHG (MISCELLANEOUS) ×1 IMPLANT
GLOVE BIOGEL PI IND STRL 7.5 (GLOVE) ×1 IMPLANT
GLOVE BIOGEL PI INDICATOR 7.5 (GLOVE) ×2
GOWN STRL REUS W/ TWL LRG LVL3 (GOWN DISPOSABLE) ×1 IMPLANT
GOWN STRL REUS W/ TWL XL LVL3 (GOWN DISPOSABLE) ×1 IMPLANT
GOWN STRL REUS W/TWL LRG LVL3 (GOWN DISPOSABLE) ×3
GOWN STRL REUS W/TWL XL LVL3 (GOWN DISPOSABLE) ×3
KIT TURNOVER CYSTO (KITS) ×3 IMPLANT
PACK CYSTO AR (MISCELLANEOUS) ×3 IMPLANT
SET CYSTO W/LG BORE CLAMP LF (SET/KITS/TRAYS/PACK) ×3 IMPLANT
SURGILUBE 2OZ TUBE FLIPTOP (MISCELLANEOUS) ×3 IMPLANT
SYSTEM UROLIFT (Male Continence) ×18 IMPLANT
WATER STERILE IRR 1000ML POUR (IV SOLUTION) ×3 IMPLANT
WATER STERILE IRR 3000ML UROMA (IV SOLUTION) ×3 IMPLANT

## 2020-03-21 NOTE — Anesthesia Procedure Notes (Signed)
Date/Time: 03/21/2020 8:30 AM Performed by: Johnna Acosta, CRNA Pre-anesthesia Checklist: Patient identified, Emergency Drugs available, Suction available, Patient being monitored and Timeout performed Patient Re-evaluated:Patient Re-evaluated prior to induction Oxygen Delivery Method: Simple face mask Preoxygenation: Pre-oxygenation with 100% oxygen Induction Type: IV induction

## 2020-03-21 NOTE — H&P (Signed)
   03/21/2020 8:19 AM   Henry Park 03/22/1932 [id number]  Referring provider: No referring provider defined for this encounter.  No chief complaint on file.   HPI: 86 year old male with history of BPH and bothersome lower urinary tract symptoms including sensation incomplete emptying, frequency, urgency, urinary hesitancy and weak urinary stream.  IPSS 27/35.  Cystoscopy with moderate lateral lobe enlargement.  He presents today for insertion UroLift   PMH: Past Medical History:  Diagnosis Date  . Arthritis   . BPH (benign prostatic hyperplasia)   . Cerebrovascular disease   . CHF (congestive heart failure) ([place])   . Colitis   . Dementia ([place])   . Dysrhythmia    atrial fibrillation  . GERD (gastroesophageal reflux disease)   . Headache    H/O MIGRAINES  . History of kidney stones    H/O  . Hyperlipidemia   . Psoriasis   . Shortness of breath dyspnea   . Skin cancer   . Sleep apnea   . Stroke [facility]) 2000    Surgical History: Past Surgical History:  Procedure Laterality Date  . COLONOSCOPY    . COLONOSCOPY WITH PROPOFOL N/A 06/19/2015   Procedure: COLONOSCOPY WITH PROPOFOL;  Surgeon: Hulen Luster, MD;  Location: [facility] ENDOSCOPY;  Service: Gastroenterology;  Laterality: N/A;    Home Medications:  . atorvastatin (LIPITOR) 10 MG tablet  . donepezil (ARICEPT) 10 MG tablet  . doxazosin (CARDURA) 4 MG tablet  . finasteride (PROSCAR) 5 MG tablet  . hyoscyamine (LEVSIN SL) 0.125 MG SL tablet  . metoprolol (LOPRESSOR) 50 MG tablet  . pantoprazole (PROTONIX) 40 MG tablet  . vitamin B-12 (CYANOCOBALAMIN) 1000 MCG tablet  . warfarin (COUMADIN) 3 MG tablet    Allergies: No Known Allergies  Family History: History reviewed. No pertinent family history.  Social History:  reports that he quit smoking about 60 years ago. His smoking use included cigarettes. He has a 40.00 pack-year smoking history. He has never used smokeless tobacco. He reports that he does not drink  alcohol and does not use drugs.   Physical Exam: BP 109/67   Pulse 70   Temp (!) 97 F (36.1 C) (Temporal)   Resp 20   SpO2 99%   Constitutional:  Alert and oriented, No acute distress. HEENT: [name] AT, moist mucus membranes.  Trachea midline, no masses. Cardiovascular: No clubbing, cyanosis, or edema.  RRR Respiratory: Normal respiratory effort, no increased work of breathing.  Clear GI: Abdomen is soft, nontender, nondistended, no abdominal masses    Assessment & Plan:    1.  BPH with obstruction He presents for UroLift placement.  The procedure has been discussed in detail as per prior office notes.  All questions were answered and he desires to proceed.   Abbie Sons, [place] [address] [place], [place] [address] [phone]

## 2020-03-21 NOTE — Transfer of Care (Signed)
Immediate Anesthesia Transfer of Care Note  Patient: Henry Park  Procedure(s) Performed: CYSTOSCOPY WITH INSERTION OF UROLIFT (N/A )  Patient Location: PACU  Anesthesia Type:General  Level of Consciousness: drowsy  Airway & Oxygen Therapy: Patient Spontanous Breathing and Patient connected to nasal cannula oxygen  Post-op Assessment: Report given to RN and Post -op Vital signs reviewed and stable  Post vital signs: Reviewed and stable  Last Vitals:  Vitals Value Taken Time  BP 112/49 03/21/20 0912  Temp    Pulse 55 03/21/20 0913  Resp 7 03/21/20 0913  SpO2 100 % 03/21/20 0913  Vitals shown include unvalidated device data.  Last Pain:  Vitals:   03/21/20 0737  TempSrc: Temporal  PainSc: 0-No pain         Complications: No complications documented.

## 2020-03-21 NOTE — Discharge Instructions (Signed)
AMBULATORY SURGERY  DISCHARGE INSTRUCTIONS   1) The drugs that you were given will stay in your system until tomorrow so for the next 24 hours you should not:  A) Drive an automobile B) Make any legal decisions C) Drink any alcoholic beverage   2) You may resume regular meals tomorrow.  Today it is better to start with liquids and gradually work up to solid foods.  You may eat anything you prefer, but it is better to start with liquids, then soup and crackers, and gradually work up to solid foods.   3) Please notify your doctor immediately if you have any unusual bleeding, trouble breathing, redness and pain at the surgery site, drainage, fever, or pain not relieved by medication.    4) Additional Instructions:        Please contact your physician with any problems or Same Day Surgery at [phone], Monday through Friday 6 am to 4 pm, or [place] at [facility] number at [phone].Urolift Post-Operative Instructions     Patient Expectations   1. Mild blood in your urine for about 1 week.  2. Urinary buring, frequency, and urgency for 10-14 days.  3. Mild pelvic pain 1-2 weeks.     Return to Activity     1. Drink water post procedure.  2. Take meds as needed.  Tylenol and/or Motrin is most helpful.  You may also by Pyridium/Azo over-the-counter for urinary burning.  Continue taking any prostate medications until your first postoperative visit.    3. No lifting or straining 48hrs.  4. Other activity when they feel up to it.  5.  Call our office [phone]) [phone] for any questions or concerns.  6.  You may resume your Coumadin on 03/22/2020

## 2020-03-21 NOTE — Anesthesia Postprocedure Evaluation (Signed)
Anesthesia Post Note  Patient: Henry Park  Procedure(s) Performed: CYSTOSCOPY WITH INSERTION OF UROLIFT (N/A )  Patient location during evaluation: PACU Anesthesia Type: General Level of consciousness: awake and alert Pain management: pain level controlled Vital Signs Assessment: post-procedure vital signs reviewed and stable Respiratory status: spontaneous breathing and respiratory function stable Cardiovascular status: stable Anesthetic complications: no   No complications documented.   Last Vitals:  Vitals:   03/21/20 0942 03/21/20 0955  BP: 131/62 (!) 128/58  Pulse:  65  Resp:  18  Temp: (!) 36.3 C 36.6 C  SpO2:  97%    Last Pain:  Vitals:   03/21/20 0955  TempSrc: Temporal  PainSc: 0-No pain                 Bird Swetz,Terin K

## 2020-03-21 NOTE — Anesthesia Preprocedure Evaluation (Signed)
Anesthesia Evaluation  Patient identified by MRN, date of birth, ID band Patient awake    Reviewed: Allergy & Precautions, NPO status , Patient's Chart, lab work & pertinent test results  History of Anesthesia Complications Negative for: history of anesthetic complications  Airway Mallampati: II       Dental   Pulmonary asthma (no inhlaers in several years) , Not current smoker, former smoker,           Cardiovascular (-) hypertension+CHF  (-) Past MI (-) Valvular Problems/Murmurs     Neuro/Psych neg Seizures Dementia CVA (L facial paralysis, L sided weakness, speech difficulties resolved )    GI/Hepatic Neg liver ROS, GERD  Medicated,  Endo/Other  neg diabetes  Renal/GU negative Renal ROS     Musculoskeletal   Abdominal   Peds  Hematology   Anesthesia Other Findings   Reproductive/Obstetrics                             Anesthesia Physical Anesthesia Plan  ASA: III  Anesthesia Plan: General   Post-op Pain Management:    Induction: Intravenous  PONV Risk Score and Plan: 2 and Propofol infusion and TIVA  Airway Management Planned: Nasal Cannula  Additional Equipment:   Intra-op Plan:   Post-operative Plan:   Informed Consent: I have reviewed the patients History and Physical, chart, labs and discussed the procedure including the risks, benefits and alternatives for the proposed anesthesia with the patient or authorized representative who has indicated his/her understanding and acceptance.       Plan Discussed with:   Anesthesia Plan Comments:         Anesthesia Quick Evaluation

## 2020-03-22 ENCOUNTER — Encounter: Payer: Self-pay | Admitting: Urology

## 2020-03-22 NOTE — Op Note (Signed)
Preoperative diagnosis:  1. BPH with LUTS 2. Incomplete bladder emptying 3. Urinary frequency 4. Urinary urgency 5. Urinary hesitancy 6. Intermittent urinary stream 7. Weak urinary stream 8. Nocturia  Postoperative diagnosis: BPH with LUTS  Procedure: Urolift (placement of 6 implants)  Surgeon: Bernardo Heater  Anesthesia: MAC  Complications: None  Drains: None  Estimated blood loss: < 5 mL  Indications: 86 year old male with obstructive symptomatology secondary to BPH.  IPSS 27/35 indicating severe symptoms, PVR 105 mL.  The patient's symptoms have progressed, and he has requested further management.  Management options including TURP with resection/ablation of the prostate as well as Urolift were discussed.  The patient has chosen to have a Urolift procedure.  He has been instructed to the procedure as well as risks and complications which include but are not limited to infection, bleeding, and inadequate treatment with the Urolift procedure alone, anesthetic complications, among others.  He understands these and desires to proceed.  Findings: Using the 17 French cystoscope, urethra and bladder were inspected.  There were no urethral lesions.  Prostatic urethra was remarkable for lateral lobe enlargement and moderate bladder neck elevation.  The bladder was inspected circumferentially.  This revealed moderate to severe trabeculation.  Description of procedure: The patient was properly identified in the holding area.  He received preoperative IV antibiotics.  He was taken to the operating room where IV sedation was obtained.  He is placed in the dorsolithotomy position.  Genitalia and perineum were prepped and draped.  Proper timeout was performed.  A 75F cystoscope was inserted into the bladder. The cystoscopy bridge was replaced with a UroLift delivery device.  The 1st pair of implants were placed 1.5-2 cm from the bladder neck.  The 2nd pair of implants were placed just proximal to the  verumontanum. The The delivery device was then replaced with cystoscope and bridge and the implant location and opening effect was confirmed cystoscopically.   Due to the bladder neck elevation it was elected to place an additional pair of stacked implants proximally superior to the initial pair.  A final cystoscopy was conducted first to inspect the location and state of each implant and second, to confirm the presence of a continuous anterior channel was present through the prostatic urethra with irrigation flow turned off.  No injury to the bladder or urethra was detected. Six implants were delivered in total.   He tolerated procedure well and was transported to the PACU in stable condition.    Charlize Hathaway [employer]. MD

## 2020-03-24 ENCOUNTER — Telehealth: Payer: Self-pay

## 2020-03-24 NOTE — Telephone Encounter (Signed)
Patients wife, Henry Park called stating [name] had a procedure done on 03/21/2020 (Urolift) she said yesterday patient was cold and could not get warm. Today she states he has a severe headache on the  Left side by his eye and is disoriented. As per Dr. Bernardo Heater patient should go to the ER. Patient wife verbalized understanding.

## 2021-07-05 ENCOUNTER — Other Ambulatory Visit: Payer: Self-pay | Admitting: Nurse Practitioner

## 2021-07-05 ENCOUNTER — Other Ambulatory Visit (HOSPITAL_COMMUNITY): Payer: Self-pay | Admitting: Nurse Practitioner

## 2021-07-05 DIAGNOSIS — R1084 Generalized abdominal pain: Secondary | ICD-10-CM

## 2021-07-05 DIAGNOSIS — R634 Abnormal weight loss: Secondary | ICD-10-CM

## 2021-07-05 DIAGNOSIS — R1310 Dysphagia, unspecified: Secondary | ICD-10-CM

## 2021-07-09 ENCOUNTER — Other Ambulatory Visit: Payer: Self-pay

## 2021-07-09 ENCOUNTER — Ambulatory Visit
Admission: RE | Admit: 2021-07-09 | Discharge: 2021-07-09 | Disposition: A | Discharge status: Home / Self Care | Payer: Medicare Other | Source: Ambulatory Visit | Attending: Nurse Practitioner | Admitting: Nurse Practitioner

## 2021-07-09 DIAGNOSIS — R1310 Dysphagia, unspecified: Secondary | ICD-10-CM

## 2021-07-09 DIAGNOSIS — R634 Abnormal weight loss: Secondary | ICD-10-CM | POA: Insufficient documentation

## 2021-07-09 DIAGNOSIS — R1084 Generalized abdominal pain: Secondary | ICD-10-CM | POA: Diagnosis present

## 2021-07-09 MED ORDER — IOHEXOL 300 MG/ML  SOLN
100.0000 mL | Freq: Once | INTRAMUSCULAR | Status: AC | PRN
  Administered 2021-07-09: 100 mL via INTRAVENOUS

## 2021-10-15 ENCOUNTER — Encounter: Payer: Self-pay | Admitting: *Deleted

## 2021-10-16 ENCOUNTER — Encounter
Admission: RE | Disposition: A | Discharge status: Home / Self Care | Payer: Self-pay | Source: Home / Self Care | Attending: Gastroenterology

## 2021-10-16 ENCOUNTER — Ambulatory Visit
Admission: RE | Admit: 2021-10-16 | Discharge: 2021-10-16 | Disposition: A | Discharge status: Home / Self Care | Payer: Medicare Other | Attending: Gastroenterology | Admitting: Gastroenterology

## 2021-10-16 ENCOUNTER — Encounter: Payer: Self-pay | Admitting: Anesthesiology

## 2021-10-16 ENCOUNTER — Encounter: Payer: Self-pay | Admitting: *Deleted

## 2021-10-16 ENCOUNTER — Other Ambulatory Visit: Payer: Self-pay

## 2021-10-16 DIAGNOSIS — Z539 Procedure and treatment not carried out, unspecified reason: Secondary | ICD-10-CM | POA: Diagnosis present

## 2021-10-16 SURGERY — ESOPHAGOGASTRODUODENOSCOPY (EGD) WITH PROPOFOL
Anesthesia: General

## 2021-10-16 MED ORDER — SODIUM CHLORIDE 0.9 % IV SOLN: INTRAVENOUS | Status: DC

## 2021-10-16 NOTE — Progress Notes (Signed)
Patient takes Coumadin and was not given instructions to stop prior to the scheduled Upper Endoscopy with Dr Haig Prophet today.  Dr Haig Prophet spoke with the patient and it was determined to reschedule the procedure once the patient has been off the Coumadin for 5 days.  The patient agrees with the plan.  Office to notify patient for a reschedule date. ?

## 2021-10-16 NOTE — Anesthesia Preprocedure Evaluation (Deleted)
Anesthesia Evaluation  ?Patient identified by MRN, date of birth, ID band ?Patient awake ? ? ? ?Reviewed: ?Allergy & Precautions, NPO status , Patient's Chart, lab work & pertinent test results ? ?History of Anesthesia Complications ?Negative for: history of anesthetic complications ? ?Airway ?Mallampati: II ? ? ? ? ? ? Dental ?  ?Pulmonary ?asthma (no inhlaers in several years) , sleep apnea , Not current smoker, former smoker,  ?  ? ? ? ? ? ? ? Cardiovascular ?(-) hypertension+CHF  ?(-) Past MI + dysrhythmias Atrial Fibrillation (-) Valvular Problems/Murmurs ? ? ?  ?Neuro/Psych ?neg Seizures Dementia CVA (L facial paralysis, L sided weakness, speech difficulties resolved )   ? GI/Hepatic ?Neg liver ROS, GERD  Medicated,  ?Endo/Other  ?neg diabetes ? Renal/GU ?negative Renal ROS  ? ?  ?Musculoskeletal ? ? Abdominal ?  ?Peds ? Hematology ?thrombocytopenia   ?Anesthesia Other Findings ? ? Reproductive/Obstetrics ? ?  ? ? ? ? ? ? ? ? ? ? ? ? ? ?  ?  ? ? ? ? ? ? ? ? ?Anesthesia Physical ? ?Anesthesia Plan ? ?ASA: III ? ?Anesthesia Plan: General  ? ?Post-op Pain Management:   ? ?Induction: Intravenous ? ?PONV Risk Score and Plan: 2 and Propofol infusion and TIVA ? ?Airway Management Planned: Natural Airway and Simple Face Mask ? ?Additional Equipment:  ? ?Intra-op Plan:  ? ?Post-operative Plan:  ? ?Informed Consent:  ? ?Plan Discussed with:  ? ?Anesthesia Plan Comments:   ? ? ? ? ? ? ?Anesthesia Quick Evaluation ? ?

## 2022-02-04 ENCOUNTER — Encounter: Payer: Self-pay | Admitting: *Deleted

## 2022-02-05 ENCOUNTER — Ambulatory Visit: Payer: Medicare Other | Admitting: Anesthesiology

## 2022-02-05 ENCOUNTER — Other Ambulatory Visit: Payer: Self-pay

## 2022-02-05 ENCOUNTER — Ambulatory Visit
Admission: RE | Admit: 2022-02-05 | Discharge: 2022-02-05 | Disposition: A | Discharge status: Home / Self Care | Payer: Medicare Other | Attending: Gastroenterology | Admitting: Gastroenterology

## 2022-02-05 ENCOUNTER — Encounter: Payer: Self-pay | Admitting: *Deleted

## 2022-02-05 ENCOUNTER — Encounter
Admission: RE | Disposition: A | Discharge status: Home / Self Care | Payer: Self-pay | Source: Home / Self Care | Attending: Gastroenterology

## 2022-02-05 DIAGNOSIS — R634 Abnormal weight loss: Secondary | ICD-10-CM | POA: Diagnosis not present

## 2022-02-05 DIAGNOSIS — R1310 Dysphagia, unspecified: Secondary | ICD-10-CM | POA: Insufficient documentation

## 2022-02-05 DIAGNOSIS — Z7901 Long term (current) use of anticoagulants: Secondary | ICD-10-CM | POA: Diagnosis not present

## 2022-02-05 DIAGNOSIS — K219 Gastro-esophageal reflux disease without esophagitis: Secondary | ICD-10-CM | POA: Insufficient documentation

## 2022-02-05 DIAGNOSIS — M1990 Unspecified osteoarthritis, unspecified site: Secondary | ICD-10-CM | POA: Diagnosis not present

## 2022-02-05 DIAGNOSIS — K3189 Other diseases of stomach and duodenum: Secondary | ICD-10-CM | POA: Diagnosis not present

## 2022-02-05 DIAGNOSIS — E785 Hyperlipidemia, unspecified: Secondary | ICD-10-CM | POA: Insufficient documentation

## 2022-02-05 DIAGNOSIS — R1084 Generalized abdominal pain: Secondary | ICD-10-CM | POA: Diagnosis present

## 2022-02-05 DIAGNOSIS — Z8673 Personal history of transient ischemic attack (TIA), and cerebral infarction without residual deficits: Secondary | ICD-10-CM | POA: Diagnosis not present

## 2022-02-05 DIAGNOSIS — Q399 Congenital malformation of esophagus, unspecified: Secondary | ICD-10-CM | POA: Insufficient documentation

## 2022-02-05 DIAGNOSIS — I4891 Unspecified atrial fibrillation: Secondary | ICD-10-CM | POA: Diagnosis not present

## 2022-02-05 DIAGNOSIS — I5032 Chronic diastolic (congestive) heart failure: Secondary | ICD-10-CM | POA: Diagnosis not present

## 2022-02-05 HISTORY — PX: ESOPHAGOGASTRODUODENOSCOPY: SHX5428

## 2022-02-05 SURGERY — EGD (ESOPHAGOGASTRODUODENOSCOPY)
Anesthesia: General

## 2022-02-05 MED ORDER — PROPOFOL 500 MG/50ML IV EMUL
INTRAVENOUS | Status: DC | PRN
  Administered 2022-02-05: 200 ug/kg/min via INTRAVENOUS

## 2022-02-05 MED ORDER — SODIUM CHLORIDE 0.9 % IV SOLN: INTRAVENOUS | Status: DC

## 2022-02-05 MED ORDER — PROPOFOL 10 MG/ML IV BOLUS
INTRAVENOUS | Status: DC | PRN
  Administered 2022-02-05: 40 mg via INTRAVENOUS

## 2022-02-05 NOTE — Anesthesia Postprocedure Evaluation (Signed)
Anesthesia Post Note  Patient: Henry Park  Procedure(s) Performed: ESOPHAGOGASTRODUODENOSCOPY (EGD)  Patient location during evaluation: Endoscopy Anesthesia Type: General Level of consciousness: awake and alert Pain management: pain level controlled Vital Signs Assessment: post-procedure vital signs reviewed and stable Respiratory status: spontaneous breathing, nonlabored ventilation, respiratory function stable and patient connected to nasal cannula oxygen Cardiovascular status: blood pressure returned to baseline and stable Postop Assessment: no apparent nausea or vomiting Anesthetic complications: no   No notable events documented.   Last Vitals:  Vitals:   02/05/22 1010 02/05/22 1011  BP: 123/72 123/72  Pulse: (!) 54 73  Resp: 20 19  Temp:    SpO2: 97% 100%    Last Pain:  Vitals:   02/05/22 0912  TempSrc: Temporal  PainSc: 0-No pain                 Precious Haws Khaleel Beckom

## 2022-02-05 NOTE — H&P (Signed)
Outpatient short stay form Pre-procedure 02/05/2022  [name], MD  Primary Physician: Baxter Hire, MD  Reason for visit:  Abdominal pain  History of present illness:    88 y/o gentleman with history of a. Fib on coumadin with last dose 5 days ago, HFpEF, HLD, and arthritis here for EGD for history of weight loss and abdominal pain along with dysphagia. Denies any dysphagia today. No known family history of GI malignancies. No significant neck or abdominal surgeries besides prostate surgery.    Current Facility-Administered Medications:    0.9 %  sodium chloride infusion, , Intravenous, Continuous, Amany Rando, Hilton Cork, MD, Last Rate: 20 mL/hr at 02/05/22 0922, New Bag at 02/05/22 1610  Medications Prior to Admission  Medication Sig Dispense Refill Last Dose   atorvastatin (LIPITOR) 10 MG tablet Take 10 mg by mouth at bedtime.    02/04/2022   donepezil (ARICEPT) 10 MG tablet Take 10 mg by mouth every morning.    02/04/2022   doxazosin (CARDURA) 4 MG tablet Take 4 mg by mouth at bedtime.    02/04/2022   finasteride (PROSCAR) 5 MG tablet Take 5 mg by mouth daily.   02/04/2022   metoprolol (LOPRESSOR) 50 MG tablet Take 50 mg by mouth 3 (three) times daily.    02/04/2022   omeprazole (PRILOSEC) 20 MG capsule Take 20 mg by mouth daily.   02/04/2022   vitamin B-12 (CYANOCOBALAMIN) 1000 MCG tablet Take 1,000 mcg by mouth daily.   02/04/2022   warfarin (COUMADIN) 3 MG tablet Take 3 mg by mouth daily.   01/30/2022     No Known Allergies   Past Medical History:  Diagnosis Date   Arthritis    BPH (benign prostatic hyperplasia)    Cerebrovascular disease    CHF (congestive heart failure) (HCC)    Colitis    Dementia (HCC)    Dysrhythmia    atrial fibrillation   GERD (gastroesophageal reflux disease)    Headache    H/O MIGRAINES   History of kidney stones    H/O   Hyperlipidemia    Psoriasis    Shortness of breath dyspnea    Skin cancer    Sleep apnea    Stroke [facility]) 2000     Review of systems:  Otherwise negative.    Physical Exam  Gen: Alert, oriented. Appears stated age.  HEENT: PERRLA. Lungs: No respiratory distress CV: RRR Abd: soft, benign, no masses Ext: No edema    Planned procedures: Proceed with EGD. The patient understands the nature of the planned procedure, indications, risks, alternatives and potential complications including but not limited to bleeding, infection, perforation, damage to internal organs and possible oversedation/side effects from anesthesia. The patient agrees and gives consent to proceed.  Please refer to procedure notes for findings, recommendations and patient disposition/instructions.     [name], MD [facility] Gastroenterology

## 2022-02-05 NOTE — Interval H&P Note (Signed)
History and Physical Interval Note:  02/05/2022 9:35 AM  Henry Park  has presented today for surgery, with the diagnosis of DYSPHAGIA ABDOMINAL PAIN.  The various methods of treatment have been discussed with the patient and family. After consideration of risks, benefits and other options for treatment, the patient has consented to  Procedure(s): ESOPHAGOGASTRODUODENOSCOPY (EGD) (N/A) as a surgical intervention.  The patient's history has been reviewed, patient examined, no change in status, stable for surgery.  I have reviewed the patient's chart and labs.  Questions were answered to the patient's satisfaction.     [name]  Ok to proceed with EGD

## 2022-02-05 NOTE — Anesthesia Procedure Notes (Signed)
Date/Time: 02/05/2022 9:41 AM  Performed by: Nelda Marseille, CRNAPre-anesthesia Checklist: Patient identified, Emergency Drugs available, Suction available, Patient being monitored and Timeout performed Oxygen Delivery Method: Nasal cannula

## 2022-02-05 NOTE — Op Note (Addendum)
[facility] Gastroenterology Patient Name: Henry Park Procedure Date: 02/05/2022 9:33 AM MRN: [id number] Account #: [id number] Date of Birth: 07/25/1932 Admit Type: Outpatient Age: 88 Room: [facility] ENDO ROOM 1 Gender: Male Note Status: Finalized Instrument Name: Upper Endoscope [id number] Procedure:             Upper GI endoscopy Indications:           Generalized abdominal pain, Dysphagia, Weight loss Providers:             Andrey Farmer MD, MD Referring MD:          Andrey Farmer MD, MD (Referring MD), Baxter Hire, MD (Referring MD) Medicines:             Monitored Anesthesia Care Complications:         No immediate complications. Procedure:             Pre-Anesthesia Assessment:                        - Prior to the procedure, a History and Physical was                         performed, and patient medications and allergies were                         reviewed. The patient is competent. The risks and                         benefits of the procedure and the sedation options and                         risks were discussed with the patient. All questions                         were answered and informed consent was obtained.                         Patient identification and proposed procedure were                         verified by the physician, the nurse, the                         anesthesiologist, the anesthetist and the technician                         in the endoscopy suite. Mental Status Examination:                         alert and oriented. Airway Examination: normal                         oropharyngeal airway and neck mobility. Respiratory                         Examination: clear to auscultation. CV Examination:  normal. Prophylactic Antibiotics: The patient does not                         require prophylactic antibiotics. Prior                         Anticoagulants: The patient has  taken Coumadin                         (warfarin), last dose was 5 days prior to procedure.                         ASA Grade Assessment: III - A patient with severe                         systemic disease. After reviewing the risks and                         benefits, the patient was deemed in satisfactory                         condition to undergo the procedure. The anesthesia                         plan was to use monitored anesthesia care (MAC).                         Immediately prior to administration of medications,                         the patient was re-assessed for adequacy to receive                         sedatives. The heart rate, respiratory rate, oxygen                         saturations, blood pressure, adequacy of pulmonary                         ventilation, and response to care were monitored                         throughout the procedure. The physical status of the                         patient was re-assessed after the procedure.                        After obtaining informed consent, the endoscope was                         passed under direct vision. Throughout the procedure,                         the patient's blood pressure, pulse, and oxygen                         saturations were monitored continuously. The Endoscope  was introduced through the mouth, and advanced to the                         second part of duodenum. The upper GI endoscopy was                         somewhat difficult due to abnormal anatomy. The                         patient tolerated the procedure well. Findings:      The examined esophagus was moderately tortuous.      A deformity was found in the entire examined stomach. This is due to       intrathoracic stomach and twisting of the stomach. The pylorus is at an       extremely acute angle.      The examined duodenum was normal. Impression:            - Tortuous esophagus.                         - Deformity in the entire stomach.                        - Normal examined duodenum.                        - No specimens collected. Recommendation:        - Discharge patient to home.                        - Resume previous diet.                        - Continue present medications.                        - Return to referring physician as previously                         scheduled. Will need to discuss if surgical                         intervention is warranted.                        - Resume Coumadin (warfarin) at prior dose today. Procedure Code(s):     --- Professional ---                        [phone], Esophagogastroduodenoscopy, flexible,                         transoral; diagnostic, including collection of                         specimen(s) by brushing or washing, when performed                         (separate procedure) Diagnosis Code(s):     --- Professional ---  Q39.9, Congenital malformation of esophagus,                         unspecified                        K31.89, Other diseases of stomach and duodenum                        R10.84, Generalized abdominal pain                        R13.10, Dysphagia, unspecified                        R63.4, Abnormal weight loss CPT copyright 2019 American Medical Association. All rights reserved. The codes documented in this report are preliminary and upon coder review may  be revised to meet current compliance requirements. Andrey Farmer MD, MD 02/05/2022 9:52:20 AM Number of Addenda: 0 Note Initiated On: 02/05/2022 9:33 AM Estimated Blood Loss:  Estimated blood loss: none.      [facility]

## 2022-02-05 NOTE — Transfer of Care (Signed)
Immediate Anesthesia Transfer of Care Note  Patient: Henry Park  Procedure(s) Performed: ESOPHAGOGASTRODUODENOSCOPY (EGD)  Patient Location: PACU  Anesthesia Type:General  Level of Consciousness: sedated  Airway & Oxygen Therapy: Patient Spontanous Breathing and Patient connected to nasal cannula oxygen  Post-op Assessment: Report given to RN and Post -op Vital signs reviewed and stable  Post vital signs: Reviewed and stable  Last Vitals:  Vitals Value Taken Time  BP 96/38 02/05/22 0951  Temp    Pulse 67 02/05/22 0951  Resp 10 02/05/22 0950  SpO2 94 % 02/05/22 0951  Vitals shown include unvalidated device data.  Last Pain:  Vitals:   02/05/22 0912  TempSrc: Temporal  PainSc: 0-No pain         Complications: No notable events documented.

## 2022-02-05 NOTE — Anesthesia Preprocedure Evaluation (Addendum)
Anesthesia Evaluation  Patient identified by MRN, date of birth, ID band Patient awake    Reviewed: Allergy & Precautions, NPO status , Patient's Chart, lab work & pertinent test results  History of Anesthesia Complications Negative for: history of anesthetic complications  Airway Mallampati: III  TM Distance: <3 FB Neck ROM: full    Dental  (+) Chipped, Missing   Pulmonary neg shortness of breath, sleep apnea , former smoker,    Pulmonary exam normal        Cardiovascular (-) angina+CHF  + dysrhythmias Atrial Fibrillation  Rhythm:irregular     Neuro/Psych  Headaches, PSYCHIATRIC DISORDERS CVA, Residual Symptoms    GI/Hepatic Neg liver ROS, GERD  Controlled,  Endo/Other  negative endocrine ROS  Renal/GU negative Renal ROS  negative genitourinary   Musculoskeletal   Abdominal   Peds  Hematology negative hematology ROS (+)   Anesthesia Other Findings Past Medical History: No date: Arthritis No date: BPH (benign prostatic hyperplasia) No date: Cerebrovascular disease No date: CHF (congestive heart failure) (HCC) No date: Colitis No date: Dementia (HCC) No date: Dysrhythmia     Comment:  atrial fibrillation No date: GERD (gastroesophageal reflux disease) No date: Headache     Comment:  H/O MIGRAINES No date: History of kidney stones     Comment:  H/O No date: Hyperlipidemia No date: Psoriasis No date: Shortness of breath dyspnea No date: Skin cancer No date: Sleep apnea 2000: Stroke [facility])  Past Surgical History: No date: COLONOSCOPY 06/19/2015: COLONOSCOPY WITH PROPOFOL; N/A     Comment:  Procedure: COLONOSCOPY WITH PROPOFOL;  Surgeon: Hulen Luster, MD;  Location: [facility] ENDOSCOPY;  Service:               Gastroenterology;  Laterality: N/A; 03/21/2020: CYSTOSCOPY WITH INSERTION OF UROLIFT; N/A     Comment:  Procedure: CYSTOSCOPY WITH INSERTION OF UROLIFT;                Surgeon: Abbie Sons, MD;  Location: [facility] ORS;                Service: Urology;  Laterality: N/A; No date: UPPER GI ENDOSCOPY  BMI    Body Mass Index: 25.99 kg/m      Reproductive/Obstetrics negative OB ROS                            Anesthesia Physical Anesthesia Plan  ASA: 3  Anesthesia Plan: General   Post-op Pain Management:    Induction: Intravenous  PONV Risk Score and Plan: Propofol infusion and TIVA  Airway Management Planned: Natural Airway and Nasal Cannula  Additional Equipment:   Intra-op Plan:   Post-operative Plan:   Informed Consent: I have reviewed the patients History and Physical, chart, labs and discussed the procedure including the risks, benefits and alternatives for the proposed anesthesia with the patient or authorized representative who has indicated his/her understanding and acceptance.     Dental Advisory Given  Plan Discussed with: Anesthesiologist, CRNA and Surgeon  Anesthesia Plan Comments: (Patient consented for risks of anesthesia including but not limited to:  - adverse reactions to medications - risk of airway placement if required - damage to eyes, teeth, lips or other oral mucosa - nerve damage due to positioning  - sore throat or hoarseness - Damage to heart, brain, nerves, lungs, other parts of body or loss of  life  Patient voiced understanding.)        Anesthesia Quick Evaluation

## 2022-02-06 ENCOUNTER — Encounter: Payer: Self-pay | Admitting: Gastroenterology

## 2022-03-15 ENCOUNTER — Encounter: Payer: Self-pay | Admitting: Dietician

## 2022-03-15 NOTE — Progress Notes (Signed)
Patient did not schedule an initial MNT visit due to lack of insurance policy reimbursement. Sent notification to referring provider.
